# Patient Record
Sex: Female | Born: 1995 | Race: Black or African American | Hispanic: No | Marital: Single | Smoking: Former smoker
Health system: Southern US, Community
[De-identification: ages and names within clinical notes are randomized; demographics above are authoritative.]

## PROBLEM LIST (undated history)

## (undated) ENCOUNTER — Inpatient Hospital Stay: Payer: Self-pay

## (undated) DIAGNOSIS — A599 Trichomoniasis, unspecified: Secondary | ICD-10-CM

## (undated) HISTORY — DX: Trichomoniasis, unspecified: A59.9

---
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
## 2015-01-06 ENCOUNTER — Emergency Department: Payer: Self-pay | Admitting: Emergency Medicine

## 2015-02-18 NOTE — H&P (Signed)
L&D Evaluation:  History:  HPI 19 yo G1 at 5624w2d  gestational age by LMP consistent with 19 weeks ultrasound.  Her pregnany complicated by the fact that she is a teenager, smoking and marijuana use (though, she has supposedly quit).  She presents with regular uterine contractions.  Denies leakage of fluid and vaginal bleeding.  She notes positive fetal movement. O+ / RPR NR/ RI / HBsAg neg / VZNI / GBS NEG (4/28)   Patient's Medical History No Chronic Illness   Patient's Surgical History none   Medications Pre Natal Vitamins   Allergies NKDA   Social History tobacco  drugs  Marijuana   Family History Non-Contributory   ROS:  ROS All systems were reviewed.  HEENT, CNS, GI, GU, Respiratory, CV, Renal and Musculoskeletal systems were found to be normal., x 10 unless noted in HPI   Exam:  Vital Signs stable   General no apparent distress   Mental Status clear   Chest clear   Heart normal sinus rhythm   Abdomen gravid, non-tender   Estimated Fetal Weight Average for gestational age   Back no CVAT   Edema no edema   Pelvic no external lesions, 4-5 cm per RN   Mebranes Intact   FHT normal rate with no decels   FHT Description 135/mod var/+accels/ no decels   Ucx regular   Skin 2-3 q 10 min   Impression:  Impression active labor, reactive NST   Plan:  Plan EFM/NST, monitor contractions and for cervical change, fluids   Comments - admit for labor - IVF/clear liquid diet - CBC/T&S - CEFM/TOCO -GBS neg - urine drug screen for history of use   Electronic Signatures: Conard NovakJackson, Stephen D (MD)  (Signed 18-May-14 15:48)  Authored: L&D Evaluation   Last Updated: 18-May-14 15:48 by Conard NovakJackson, Stephen D (MD)

## 2016-06-17 ENCOUNTER — Emergency Department
Admission: EM | Admit: 2016-06-17 | Discharge: 2016-06-17 | Disposition: A | Discharge status: Home / Self Care | Payer: Medicaid Other | Attending: Emergency Medicine | Admitting: Emergency Medicine

## 2016-06-17 DIAGNOSIS — O210 Mild hyperemesis gravidarum: Secondary | ICD-10-CM | POA: Insufficient documentation

## 2016-06-17 DIAGNOSIS — O219 Vomiting of pregnancy, unspecified: Secondary | ICD-10-CM | POA: Diagnosis present

## 2016-06-17 DIAGNOSIS — Z3A01 Less than 8 weeks gestation of pregnancy: Secondary | ICD-10-CM | POA: Insufficient documentation

## 2016-06-17 DIAGNOSIS — F17200 Nicotine dependence, unspecified, uncomplicated: Secondary | ICD-10-CM | POA: Diagnosis not present

## 2016-06-17 DIAGNOSIS — O99331 Smoking (tobacco) complicating pregnancy, first trimester: Secondary | ICD-10-CM | POA: Diagnosis not present

## 2016-06-17 DIAGNOSIS — R1110 Vomiting, unspecified: Secondary | ICD-10-CM

## 2016-06-17 DIAGNOSIS — Z3491 Encounter for supervision of normal pregnancy, unspecified, first trimester: Secondary | ICD-10-CM

## 2016-06-17 LAB — CBC
HCT: 36.2 % (ref 35.0–47.0)
HEMOGLOBIN: 12.7 g/dL (ref 12.0–16.0)
MCH: 30.6 pg (ref 26.0–34.0)
MCHC: 34.9 g/dL (ref 32.0–36.0)
MCV: 87.5 fL (ref 80.0–100.0)
PLATELETS: 204 10*3/uL (ref 150–440)
RBC: 4.14 MIL/uL (ref 3.80–5.20)
RDW: 12.3 % (ref 11.5–14.5)
WBC: 7.1 10*3/uL (ref 3.6–11.0)

## 2016-06-17 LAB — BASIC METABOLIC PANEL
ANION GAP: 6 (ref 5–15)
BUN: 9 mg/dL (ref 6–20)
CALCIUM: 9.5 mg/dL (ref 8.9–10.3)
CO2: 25 mmol/L (ref 22–32)
CREATININE: 0.68 mg/dL (ref 0.44–1.00)
Chloride: 104 mmol/L (ref 101–111)
GFR calc non Af Amer: >60 mL/min (ref >60)
Glucose, Bld: 100 mg/dL — ABNORMAL HIGH (ref 65–99)
Potassium: 3.6 mmol/L (ref 3.5–5.1)
SODIUM: 135 mmol/L (ref 135–145)

## 2016-06-17 LAB — URINALYSIS COMPLETE WITH MICROSCOPIC (ARMC ONLY)
BILIRUBIN URINE: NEGATIVE
Bacteria, UA: NONE SEEN
Glucose, UA: NEGATIVE mg/dL
HGB URINE DIPSTICK: NEGATIVE
LEUKOCYTES UA: NEGATIVE
NITRITE: NEGATIVE
PH: 6 (ref 5.0–8.0)
PROTEIN: 30 mg/dL — AB
RBC / HPF: NONE SEEN RBC/hpf (ref 0–5)
SPECIFIC GRAVITY, URINE: 1.033 — AB (ref 1.005–1.030)

## 2016-06-17 LAB — HCG, QUANTITATIVE, PREGNANCY: hCG, Beta Chain, Quant, S: 112766 m[IU]/mL — ABNORMAL HIGH (ref <5)

## 2016-06-17 MED ORDER — SODIUM CHLORIDE 0.9 % IV SOLN
Freq: Once | INTRAVENOUS | Status: AC
  Administered 2016-06-17: 20:00:00 via INTRAVENOUS

## 2016-06-17 MED ORDER — ONDANSETRON HCL 4 MG PO TABS: 4.0000 mg | ORAL_TABLET | Freq: Every day | ORAL | 1 refills | Status: DC | PRN

## 2016-06-17 MED ORDER — ONDANSETRON HCL 4 MG/2ML IJ SOLN
4.0000 mg | Freq: Once | INTRAMUSCULAR | Status: DC
  Filled 2016-06-17: qty 2

## 2016-06-17 NOTE — ED Triage Notes (Signed)
Pt arrives with reports of excessive vomiting over the last several days  Pt reports last menstrual cycle sometime around 04/19/2016 and estimates that she is [redacted] weeks pregnant   Pt reports that yesterday her emesis was green and had large green clots

## 2016-06-17 NOTE — ED Provider Notes (Signed)
[facility] Emergency Department Provider Note        Time seen: ----------------------------------------- 5:14 PM on 06/17/2016 -----------------------------------------    I have reviewed the triage vital signs and the nursing notes.   HISTORY  Chief Complaint Morning Sickness; Headache; and Dizziness    HPI Jamie Gentry is a 20 y.o. female who presents to the ER for excessive vomiting over the last several days. Patient thinks she is around [redacted] weeks pregnant, last menstrual cycle was around July 10. She is G2 P1 AB 0, reports that yesterday here her emesis was green and previously he had have been yellow. She did eat breakfast normally this morning. She states she did not have this problem with the first pregnancy.   No past medical history on file.  There are no active problems to display for this patient.   No past surgical history on file.  Allergies Review of patient's allergies indicates no known allergies.  Social History Social History  Substance Use Topics  . Smoking status: Current Every Day Smoker  . Smokeless tobacco: Not on file  . Alcohol use No    Review of Systems Constitutional: Negative for fever. Cardiovascular: Negative for chest pain. Respiratory: Negative for shortness of breath. Gastrointestinal: Negative for abdominal pain,Positive for vomiting Genitourinary: Negative for dysuria. Musculoskeletal: Negative for back pain. Skin: Negative for rash. Neurological: Negative for headaches, positive for weakness  10-point ROS otherwise negative.  ____________________________________________   PHYSICAL EXAM:  VITAL SIGNS: ED Triage Vitals  Enc Vitals Group     BP 06/17/16 1448 101/71     Pulse Rate 06/17/16 1448 98     Resp 06/17/16 1448 20     Temp 06/17/16 1448 98.5 F (36.9 C)     Temp Source 06/17/16 1448 Oral     SpO2 06/17/16 1448 100 %     Weight 06/17/16 1450 162 lb (73.5 kg)     Height 06/17/16  1450 5\' 3"  (1.6 m)     Head Circumference --      Peak Flow --      Pain Score 06/17/16 1454 8     Pain Loc --      Pain Edu? --      Excl. in GC? --     Constitutional: Alert and oriented. Well appearing and in no distress. Eyes: Conjunctivae are normal. PERRL. Normal extraocular movements. ENT   Head: Normocephalic and atraumatic.   Nose: No congestion/rhinnorhea.   Mouth/Throat: Mucous membranes are moist.   Neck: No stridor. Cardiovascular: Normal rate, regular rhythm. No murmurs, rubs, or gallops. Respiratory: Normal respiratory effort without tachypnea nor retractions. Breath sounds are clear and equal bilaterally. No wheezes/rales/rhonchi. Gastrointestinal: Soft and nontender. Normal bowel sounds Musculoskeletal: Nontender with normal range of motion in all extremities. No lower extremity tenderness nor edema. Neurologic:  Normal speech and language. No gross focal neurologic deficits are appreciated.  Skin:  Skin is warm, dry and intact. No rash noted. Psychiatric: Mood and affect are normal. Speech and behavior are normal.  ____________________________________________  ED COURSE:  Pertinent labs & imaging results that were available during my care of the patient were reviewed by me and considered in my medical decision making (see chart for details). Clinical Course  Patient is in no distress, we will assess with basic labs, give IV fluids and antiemetics.  Procedures ____________________________________________   LABS (pertinent positives/negatives)  Labs Reviewed  BASIC METABOLIC PANEL - Abnormal; Notable for the following:  Result Value   Glucose, Bld 100 (*)    All other components within normal limits  URINALYSIS COMPLETEWITH MICROSCOPIC ([facility] ONLY) - Abnormal; Notable for the following:    Color, Urine YELLOW (*)    APPearance HAZY (*)    Ketones, ur TRACE (*)    Specific Gravity, Urine 1.033 (*)    Protein, ur 30 (*)    Squamous  Epithelial / LPF 0-5 (*)    All other components within normal limits  HCG, QUANTITATIVE, PREGNANCY - Abnormal; Notable for the following:    hCG, Beta Chain, Quant, S 112,766 (*)    All other components within normal limits  CBC  ____________________________________________  FINAL ASSESSMENT AND PLAN  Hyperemesis  Plan: Patient with labs as dictated above. Patient is likely [redacted] weeks pregnant, she has no abdominal pain and a normal exam. Labs are reassuring other than perhaps mild dehydration. She'll be discharged with antiemetics and referred to GYN for follow-up.   Emily FilbertWilliams, Jamie Pennel E, MD   Note: This dictation was prepared with Dragon dictation. Any transcriptional errors that result from this process are unintentional    Emily FilbertJonathan Gentry Sandra Tellefsen, MD 06/17/16 [phone]

## 2016-06-18 LAB — POCT PREGNANCY, URINE: PREG TEST UR: POSITIVE — AB

## 2016-06-22 ENCOUNTER — Emergency Department
Admission: EM | Admit: 2016-06-22 | Discharge: 2016-06-22 | Disposition: A | Discharge status: Home / Self Care | Payer: Medicaid Other | Attending: Emergency Medicine | Admitting: Emergency Medicine

## 2016-06-22 ENCOUNTER — Emergency Department: Payer: Medicaid Other

## 2016-06-22 DIAGNOSIS — O99331 Smoking (tobacco) complicating pregnancy, first trimester: Secondary | ICD-10-CM | POA: Insufficient documentation

## 2016-06-22 DIAGNOSIS — R1030 Lower abdominal pain, unspecified: Secondary | ICD-10-CM | POA: Insufficient documentation

## 2016-06-22 DIAGNOSIS — O210 Mild hyperemesis gravidarum: Secondary | ICD-10-CM | POA: Insufficient documentation

## 2016-06-22 DIAGNOSIS — Z3A08 8 weeks gestation of pregnancy: Secondary | ICD-10-CM | POA: Diagnosis not present

## 2016-06-22 DIAGNOSIS — O26891 Other specified pregnancy related conditions, first trimester: Secondary | ICD-10-CM | POA: Diagnosis present

## 2016-06-22 DIAGNOSIS — F17200 Nicotine dependence, unspecified, uncomplicated: Secondary | ICD-10-CM | POA: Insufficient documentation

## 2016-06-22 LAB — COMPREHENSIVE METABOLIC PANEL
ALBUMIN: 4.2 g/dL (ref 3.5–5.0)
ALK PHOS: 49 U/L (ref 38–126)
ALT: 21 U/L (ref 14–54)
AST: 14 U/L — AB (ref 15–41)
Anion gap: 6 (ref 5–15)
BILIRUBIN TOTAL: 0.6 mg/dL (ref 0.3–1.2)
BUN: 8 mg/dL (ref 6–20)
CO2: 24 mmol/L (ref 22–32)
CREATININE: 0.62 mg/dL (ref 0.44–1.00)
Calcium: 9.7 mg/dL (ref 8.9–10.3)
Chloride: 103 mmol/L (ref 101–111)
GFR calc Af Amer: >60 mL/min (ref >60)
GFR calc non Af Amer: >60 mL/min (ref >60)
GLUCOSE: 72 mg/dL (ref 65–99)
POTASSIUM: 3.7 mmol/L (ref 3.5–5.1)
Sodium: 133 mmol/L — ABNORMAL LOW (ref 135–145)
TOTAL PROTEIN: 7.5 g/dL (ref 6.5–8.1)

## 2016-06-22 LAB — CBC WITH DIFFERENTIAL/PLATELET
BASOS ABS: 0 10*3/uL (ref 0–0.1)
BASOS PCT: 0 %
Eosinophils Absolute: 0 10*3/uL (ref 0–0.7)
Eosinophils Relative: 1 %
HEMATOCRIT: 36.5 % (ref 35.0–47.0)
HEMOGLOBIN: 13 g/dL (ref 12.0–16.0)
LYMPHS PCT: 25 %
Lymphs Abs: 1.8 10*3/uL (ref 1.0–3.6)
MCH: 31.1 pg (ref 26.0–34.0)
MCHC: 35.5 g/dL (ref 32.0–36.0)
MCV: 87.6 fL (ref 80.0–100.0)
Monocytes Absolute: 0.6 10*3/uL (ref 0.2–0.9)
Monocytes Relative: 8 %
NEUTROS ABS: 4.7 10*3/uL (ref 1.4–6.5)
NEUTROS PCT: 66 %
Platelets: 185 10*3/uL (ref 150–440)
RBC: 4.17 MIL/uL (ref 3.80–5.20)
RDW: 12.3 % (ref 11.5–14.5)
WBC: 7.1 10*3/uL (ref 3.6–11.0)

## 2016-06-22 LAB — URINALYSIS COMPLETE WITH MICROSCOPIC (ARMC ONLY)
BILIRUBIN URINE: NEGATIVE
Bacteria, UA: NONE SEEN
Glucose, UA: NEGATIVE mg/dL
Hgb urine dipstick: NEGATIVE
Leukocytes, UA: NEGATIVE
Nitrite: NEGATIVE
PH: 7 (ref 5.0–8.0)
PROTEIN: NEGATIVE mg/dL
Specific Gravity, Urine: 1.012 (ref 1.005–1.030)

## 2016-06-22 LAB — HCG, QUANTITATIVE, PREGNANCY: hCG, Beta Chain, Quant, S: 150488 m[IU]/mL — ABNORMAL HIGH (ref <5)

## 2016-06-22 MED ORDER — SODIUM CHLORIDE 0.9 % IV BOLUS (SEPSIS)
1000.0000 mL | Freq: Once | INTRAVENOUS | Status: AC
  Administered 2016-06-22: 1000 mL via INTRAVENOUS

## 2016-06-22 MED ORDER — ONDANSETRON HCL 4 MG/2ML IJ SOLN
4.0000 mg | Freq: Once | INTRAMUSCULAR | Status: AC
  Administered 2016-06-22: 4 mg via INTRAVENOUS
  Filled 2016-06-22 (×2): qty 2

## 2016-06-22 NOTE — ED Triage Notes (Signed)
Pt c/o BL rib pain with abd pain and nausea since seen here on 9/7.Marland Kitchen. Denies vomiting or diarrhea..states she is just feeling weak..Marland Kitchen

## 2016-06-22 NOTE — ED Provider Notes (Signed)
Time Seen: Approximately 1607  I have reviewed the triage notes  Chief Complaint: Abdominal Pain   History of Present Illness: Jamie Gentry is a 20 y.o. female who was seen recently here on 9/7. Patient was diagnosed with hyperemesis gravidarum and had a negative evaluation at that time. She was prescribed Zofran. She is by dates [redacted] weeks pregnant. Patient states she didn't have any lower abdominal pain and describes today some occasional left lower quadrant abdominal pain and also bilateral upper abdominal pain associated with dry heaves. She denies any fever or productive cough or shortness of breath. She states she feels lightheaded without any syncopal episode. She denies any persistent headaches or focal weakness in either upper or lower extremities. She denies any vaginal discharge or bleeding. She denies any dysuria, hematuria or urinary frequency.   History reviewed. No pertinent past medical history.  There are no active problems to display for this patient.   History reviewed. No pertinent surgical history.  History reviewed. No pertinent surgical history.  Current Outpatient Rx  . Order #: [id number] Class: Print    Allergies:  Review of patient's allergies indicates no known allergies.  Family History: No family history on file.  Social History: Social History  Substance Use Topics  . Smoking status: Current Every Day Smoker  . Smokeless tobacco: Never Used  . Alcohol use No     Review of Systems:   10 point review of systems was performed and was otherwise negative:  Constitutional: No fever Eyes: No visual disturbances ENT: No sore throat, ear pain Cardiac: No chest pain Respiratory: No shortness of breath, wheezing, or stridor Abdomen:She describes bilateral upper abdominal discomfort crampy in nature. She also describes some mild left lower quadrant abdominal pain. She denies much in way of back or flank discomfort. She still feels nauseated without  persistent vomiting since this morning. He denies any hematemesis or biliary emesis. Endocrine: No weight loss, No night sweats Extremities: No peripheral edema, cyanosis Skin: No rashes, easy bruising Neurologic: No focal weakness, trouble with speech or swollowing Urologic: No dysuria, Hematuria, or urinary frequency   Physical Exam:  ED Triage Vitals  Enc Vitals Group     BP 06/22/16 1559 (!) 114/58     Pulse Rate 06/22/16 1559 100     Resp 06/22/16 1559 18     Temp 06/22/16 1559 98 F (36.7 C)     Temp Source 06/22/16 1559 Oral     SpO2 06/22/16 1559 100 %     Weight 06/22/16 1554 162 lb (73.5 kg)     Height 06/22/16 1554 5\' 3"  (1.6 m)     Head Circumference --      Peak Flow --      Pain Score 06/22/16 1554 8     Pain Loc --      Pain Edu? --      Excl. in GC? --     General: Awake , Alert , and Oriented times 3; GCS 15 Head: Normal cephalic , atraumatic Eyes: Pupils equal , round, reactive to light Nose/Throat: No nasal drainage, patent upper airway without erythema or exudate.  Neck: Supple, Full range of motion, No anterior adenopathy or palpable thyroid masses Lungs: Clear to ascultation without wheezes , rhonchi, or rales Heart: Regular rate, regular rhythm without murmurs , gallops , or rubs Abdomen: Soft, non tender without rebound, guarding , or rigidity; bowel sounds positive and symmetric in all 4 quadrants. No organomegaly .  No focal tenderness over McBurney's point, negative Murphy's sign Extremities: 2 plus symmetric pulses. No edema, clubbing or cyanosis Neurologic: normal ambulation, Motor symmetric without deficits, sensory intact Skin: warm, dry, no rashes   Labs:   All laboratory work was reviewed including any pertinent negatives or positives listed below:  Labs Reviewed  CBC WITH DIFFERENTIAL/PLATELET  COMPREHENSIVE METABOLIC PANEL  URINALYSIS COMPLETEWITH MICROSCOPIC ([facility] ONLY)  HCG, QUANTITATIVE, PREGNANCY  Laboratory work was  reviewed and showed no clinically significant abnormalities.    Radiology:  "US Ob Comp Less 14 Wks  Result Date: 06/22/2016 CLINICAL DATA:  20 y/o  F; left lower abdominal pain for 2 days. EXAM: OBSTETRIC <14 WK [place] AND TRANSVAGINAL OB US TECHNIQUE: Both transabdominal and transvaginal ultrasound examinations were performed for complete evaluation of the gestation as well as the maternal uterus, adnexal regions, and pelvic cul-de-sac. Transvaginal technique was performed to assess early pregnancy. COMPARISON:  None. FINDINGS: Intrauterine gestational sac: Single Yolk sac:  Present. Embryo:  Present. Cardiac Activity: Present. Heart Rate: 171  bpm CRL:  19  mm   8 w   3 d                  [place] EDC: 01/29/2017 Subchorionic hemorrhage:  None visualized. Maternal uterus/adnexae: Normal uterus. Tiny nabothian cyst. Left ovary measures 3.9 x 3.8 x 3.0 cm. Anechoic well-circumscribed lobulated structure with thin septation within the left ovary measuring 3.3 x 3.5 x 3.3 cm, likely benign, no follow-up needed. Normal right ovary measuring 3.0 x 1.8 by 2.7 cm. IMPRESSION: 1. Single live intrauterine pregnancy.  No acute process identified. 2. Left ovarian cyst with thin septation measuring up to 3.5 cm, likely benign, no follow-up needed. Electronically Signed   By: Mitzi Hansen M.D.   On: 06/22/2016 17:48   US Ob Transvaginal  Result Date: 06/22/2016 CLINICAL DATA:  20 y/o  F; left lower abdominal pain for 2 days. EXAM: OBSTETRIC <14 WK [place] AND TRANSVAGINAL OB US TECHNIQUE: Both transabdominal and transvaginal ultrasound examinations were performed for complete evaluation of the gestation as well as the maternal uterus, adnexal regions, and pelvic cul-de-sac. Transvaginal technique was performed to assess early pregnancy. COMPARISON:  None. FINDINGS: Intrauterine gestational sac: Single Yolk sac:  Present. Embryo:  Present. Cardiac Activity: Present. Heart Rate: 171  bpm CRL:  19  mm   8 w   3 d                   [place] EDC: 01/29/2017 Subchorionic hemorrhage:  None visualized. Maternal uterus/adnexae: Normal uterus. Tiny nabothian cyst. Left ovary measures 3.9 x 3.8 x 3.0 cm. Anechoic well-circumscribed lobulated structure with thin septation within the left ovary measuring 3.3 x 3.5 x 3.3 cm, likely benign, no follow-up needed. Normal right ovary measuring 3.0 x 1.8 by 2.7 cm. IMPRESSION: 1. Single live intrauterine pregnancy.  No acute process identified. 2. Left ovarian cyst with thin septation measuring up to 3.5 cm, likely benign, no follow-up needed. Electronically Signed   By: Mitzi Hansen M.D.   On: 06/22/2016 17:48  "   I personally reviewed the radiologic studies    ED Course: * Patient's stay was uneventful and she felt symptomatically improved after a liter of fluid, IV Zofran,. Patient appears to have some residual abdominal discomfort from hyperemesis gravidarum. Her ultrasound shows a normally located, normally developing intrauterine pregnancy. I felt this was unlikely to be a surgical issue such as acute appendicitis or acute cholecystitis. Patient does have  [facility]     Assessment:  Hyperemesis gravidarum      Plan:  Outpatient Patient was advised to return immediately if condition worsens. Patient was advised to follow up with their primary care physician or other specialized physicians involved in their outpatient care. The patient and/or family member/power of attorney had laboratory results reviewed at the bedside. All questions and concerns were addressed and appropriate discharge instructions were distributed by the nursing staff.              Jennye MoccasinBrian S Quigley, MD 06/22/16 [phone]

## 2016-06-22 NOTE — Discharge Instructions (Signed)
Please take over-the-counter Tylenol. You can take 2 Tylenol extra strength tablets every 6 hours. Over-the-counter Maalox or Mylanta. Return to the emergency Department for persistent vaginal bleeding, vomit blood, fever, or any other new concerns.  Please return immediately if condition worsens. Please contact her primary physician or the physician you were given for referral. If you have any specialist physicians involved in her treatment and plan please also contact them. Thank you for using [place] [facility] emergency Department.

## 2016-07-15 DIAGNOSIS — A599 Trichomoniasis, unspecified: Secondary | ICD-10-CM

## 2016-07-15 HISTORY — DX: Trichomoniasis, unspecified: A59.9

## 2016-07-15 LAB — OB RESULTS CONSOLE HIV ANTIBODY (ROUTINE TESTING)
HIV: NONREACTIVE
HIV: NONREACTIVE

## 2016-07-15 LAB — OB RESULTS CONSOLE ABO/RH: RH TYPE: POSITIVE

## 2016-07-15 LAB — OB RESULTS CONSOLE GC/CHLAMYDIA
Chlamydia: NEGATIVE
GC PROBE AMP, GENITAL: NEGATIVE

## 2016-07-15 LAB — OB RESULTS CONSOLE HEPATITIS B SURFACE ANTIGEN: Hepatitis B Surface Ag: NEGATIVE

## 2016-07-15 LAB — OB RESULTS CONSOLE RPR
RPR: NONREACTIVE
RPR: NONREACTIVE
RPR: NONREACTIVE

## 2016-07-15 LAB — OB RESULTS CONSOLE VARICELLA ZOSTER ANTIBODY, IGG: Varicella: NON-IMMUNE/NOT IMMUNE

## 2016-07-15 LAB — SICKLE CELL SCREEN: Sickle Cell Screen: NORMAL

## 2016-07-15 LAB — OB RESULTS CONSOLE ANTIBODY SCREEN: ANTIBODY SCREEN: NEGATIVE

## 2016-07-15 LAB — OB RESULTS CONSOLE RUBELLA ANTIBODY, IGM: RUBELLA: IMMUNE

## 2016-07-25 ENCOUNTER — Emergency Department
Admission: EM | Admit: 2016-07-25 | Discharge: 2016-07-25 | Disposition: A | Discharge status: Home / Self Care | Payer: Medicaid Other | Attending: Emergency Medicine | Admitting: Emergency Medicine

## 2016-07-25 ENCOUNTER — Emergency Department: Payer: Medicaid Other

## 2016-07-25 DIAGNOSIS — R51 Headache: Secondary | ICD-10-CM | POA: Diagnosis not present

## 2016-07-25 DIAGNOSIS — Z3A14 14 weeks gestation of pregnancy: Secondary | ICD-10-CM | POA: Diagnosis not present

## 2016-07-25 DIAGNOSIS — O219 Vomiting of pregnancy, unspecified: Secondary | ICD-10-CM | POA: Insufficient documentation

## 2016-07-25 DIAGNOSIS — R0602 Shortness of breath: Secondary | ICD-10-CM | POA: Diagnosis not present

## 2016-07-25 DIAGNOSIS — O99331 Smoking (tobacco) complicating pregnancy, first trimester: Secondary | ICD-10-CM | POA: Insufficient documentation

## 2016-07-25 DIAGNOSIS — F17200 Nicotine dependence, unspecified, uncomplicated: Secondary | ICD-10-CM | POA: Insufficient documentation

## 2016-07-25 DIAGNOSIS — O26891 Other specified pregnancy related conditions, first trimester: Secondary | ICD-10-CM | POA: Insufficient documentation

## 2016-07-25 DIAGNOSIS — R519 Headache, unspecified: Secondary | ICD-10-CM

## 2016-07-25 DIAGNOSIS — H538 Other visual disturbances: Secondary | ICD-10-CM | POA: Insufficient documentation

## 2016-07-25 LAB — URINALYSIS COMPLETE WITH MICROSCOPIC (ARMC ONLY)
BACTERIA UA: NONE SEEN
Bilirubin Urine: NEGATIVE
GLUCOSE, UA: NEGATIVE mg/dL
Hgb urine dipstick: NEGATIVE
Ketones, ur: NEGATIVE mg/dL
Leukocytes, UA: NEGATIVE
Nitrite: NEGATIVE
PROTEIN: NEGATIVE mg/dL
Specific Gravity, Urine: 1.006 (ref 1.005–1.030)
pH: 6 (ref 5.0–8.0)

## 2016-07-25 LAB — COMPREHENSIVE METABOLIC PANEL
ALBUMIN: 3.7 g/dL (ref 3.5–5.0)
ALT: 16 U/L (ref 14–54)
AST: 16 U/L (ref 15–41)
Alkaline Phosphatase: 39 U/L (ref 38–126)
Anion gap: 4 — ABNORMAL LOW (ref 5–15)
BUN: 5 mg/dL — AB (ref 6–20)
CHLORIDE: 106 mmol/L (ref 101–111)
CO2: 24 mmol/L (ref 22–32)
CREATININE: 0.54 mg/dL (ref 0.44–1.00)
Calcium: 8.9 mg/dL (ref 8.9–10.3)
GFR calc Af Amer: >60 mL/min (ref >60)
GLUCOSE: 89 mg/dL (ref 65–99)
POTASSIUM: 4.2 mmol/L (ref 3.5–5.1)
Sodium: 134 mmol/L — ABNORMAL LOW (ref 135–145)
Total Bilirubin: 0.8 mg/dL (ref 0.3–1.2)
Total Protein: 6.5 g/dL (ref 6.5–8.1)

## 2016-07-25 LAB — CBC WITH DIFFERENTIAL/PLATELET
BASOS ABS: 0 10*3/uL (ref 0–0.1)
BASOS PCT: 0 %
Eosinophils Absolute: 0 10*3/uL (ref 0–0.7)
Eosinophils Relative: 1 %
HEMATOCRIT: 31.1 % — AB (ref 35.0–47.0)
Hemoglobin: 11.2 g/dL — ABNORMAL LOW (ref 12.0–16.0)
LYMPHS PCT: 22 %
Lymphs Abs: 1.5 10*3/uL (ref 1.0–3.6)
MCH: 31.4 pg (ref 26.0–34.0)
MCHC: 36.1 g/dL — ABNORMAL HIGH (ref 32.0–36.0)
MCV: 87.1 fL (ref 80.0–100.0)
Monocytes Absolute: 0.5 10*3/uL (ref 0.2–0.9)
Monocytes Relative: 8 %
NEUTROS ABS: 4.7 10*3/uL (ref 1.4–6.5)
Neutrophils Relative %: 69 %
PLATELETS: 178 10*3/uL (ref 150–440)
RBC: 3.57 MIL/uL — AB (ref 3.80–5.20)
RDW: 12.5 % (ref 11.5–14.5)
WBC: 6.8 10*3/uL (ref 3.6–11.0)

## 2016-07-25 LAB — FIBRIN DERIVATIVES D-DIMER (ARMC ONLY): FIBRIN DERIVATIVES D-DIMER (ARMC): 308 (ref 0–499)

## 2016-07-25 LAB — PROTEIN / CREATININE RATIO, URINE: CREATININE, URINE: 65 mg/dL

## 2016-07-25 MED ORDER — METOCLOPRAMIDE HCL 5 MG/ML IJ SOLN
10.0000 mg | Freq: Once | INTRAMUSCULAR | Status: AC
  Administered 2016-07-25: 10 mg via INTRAVENOUS

## 2016-07-25 MED ORDER — SODIUM CHLORIDE 0.9 % IV BOLUS (SEPSIS)
1000.0000 mL | Freq: Once | INTRAVENOUS | Status: AC
  Administered 2016-07-25: 1000 mL via INTRAVENOUS

## 2016-07-25 MED ORDER — ACETAMINOPHEN 500 MG PO TABS
1000.0000 mg | ORAL_TABLET | Freq: Once | ORAL | Status: AC
  Administered 2016-07-25: 1000 mg via ORAL

## 2016-07-25 NOTE — ED Provider Notes (Signed)
[facility] Emergency Department Provider Note  ____________________________________________  Time seen: Approximately 3:28 PM  I have reviewed the triage vital signs and the nursing notes.   HISTORY  Chief Complaint Shortness of Breath   HPI Jamie Gentry is a 20 y.o. female G2P1 at [redacted] weeks GA resents for evaluation of the shortness of breath and headache. Patient reports for the last 3 days she's been having intermittent episodes of shortness of breath lasting a few minutes at a time. Today she was at her brother's house and had one of these episodes which prompted her brother to call EMS. She reports that the sling, rest and exertion. She feels difficulty breathing and a sensation that the shortness of breath climbs up to her throat. These episodes resolve within a few minutes with no intervention. She denies any chest pain associated with the episodes. No cough, no fever, no personal or family history blood clots, no recent travel or immobilization, no leg pain or swelling, no hemoptysis. She is a former smoker but stopped when she found out she was pregnant. She denies any drug use. She denies any history of asthma or COPD. Patient is also complaining of headache for the last 4 days. She reports the headaches a 10 out of 10, located in her left temple area, associated with blurry vision. She has been trying Tylenol 325 mg at home with no improvement of her pain. She also reports couple episodes of nonbloody nonbilious emesis associated with the headache. She does endorse a history of migraine headaches but reports that these are worse. No neck stiffness, rash, or fever. She has already established prenatal care for this pregnancy and has had a [place] confirming IUP. No abdominal pain, contractions, vaginal bleeding.  No past medical history on file.  There are no active problems to display for this patient.   No past surgical history on file.  Prior to  Admission medications   Not on File    Allergies Review of patient's allergies indicates no known allergies.  No family history on file.  Social History Social History  Substance Use Topics  . Smoking status: Current Every Day Smoker  . Smokeless tobacco: Never Used  . Alcohol use No    Review of Systems  Constitutional: Negative for fever. Eyes: + blurry vision. ENT: Negative for sore throat. Cardiovascular: Negative for chest pain. Respiratory: + shortness of breath. Gastrointestinal: Negative for abdominal pain, diarrhea. + N/V Genitourinary: Negative for dysuria. Musculoskeletal: Negative for back pain. Skin: Negative for rash. Neurological: Negative for weakness or numbness. + HA  ____________________________________________   PHYSICAL EXAM:  VITAL SIGNS: ED Triage Vitals  Enc Vitals Group     BP 07/25/16 1521 121/75     Pulse Rate 07/25/16 1521 94     Resp 07/25/16 1521 18     Temp --      Temp src --      SpO2 07/25/16 1521 100 %     Weight 07/25/16 1522 169 lb (76.7 kg)     Height 07/25/16 1522 5\' 3"  (1.6 m)     Head Circumference --      Peak Flow --      Pain Score --      Pain Loc --      Pain Edu? --      Excl. in GC? --     Constitutional: Alert and oriented. Well appearing and in no apparent distress. HEENT:      Head:  Normocephalic and atraumatic.         Eyes: Conjunctivae are normal. Sclera is non-icteric. EOMI. PERRL      Mouth/Throat: Mucous membranes are moist.       Neck: Supple with no signs of meningismus. Cardiovascular: Regular rate and rhythm. No murmurs, gallops, or rubs. 2+ symmetrical distal pulses are present in all extremities. No JVD. Respiratory: Normal respiratory effort. Lungs are clear to auscultation bilaterally. No wheezes, crackles, or rhonchi.  Gastrointestinal: Soft, non tender, and non distended with positive bowel sounds. No rebound or guarding. Musculoskeletal: Nontender with normal range of motion in all  extremities. No edema, cyanosis, or erythema of extremities. Neurologic: Normal speech and language. A & O x3, PERRL, no nystagmus, CN II-XII intact, motor testing reveals good tone and bulk throughout. There is no evidence of pronator drift or dysmetria. Muscle strength is 5/5 throughout. Deep tendon reflexes are 2+ throughout with downgoing toes. Sensory examination is intact. Gait is normal. Skin: Skin is warm, dry and intact. No rash noted. Psychiatric: Mood and affect are normal. Speech and behavior are normal.  ____________________________________________   LABS (all labs ordered are listed, but only abnormal results are displayed)  Labs Reviewed  CBC WITH DIFFERENTIAL/PLATELET - Abnormal; Notable for the following:       Result Value   RBC 3.57 (*)    Hemoglobin 11.2 (*)    HCT 31.1 (*)    MCHC 36.1 (*)    All other components within normal limits  COMPREHENSIVE METABOLIC PANEL - Abnormal; Notable for the following:    Sodium 134 (*)    BUN 5 (*)    Anion gap 4 (*)    All other components within normal limits  URINALYSIS COMPLETEWITH MICROSCOPIC ([facility] ONLY) - Abnormal; Notable for the following:    Color, Urine STRAW (*)    APPearance CLEAR (*)    Squamous Epithelial / LPF 0-5 (*)    All other components within normal limits  FIBRIN DERIVATIVES D-DIMER ([facility] ONLY)  PROTEIN / CREATININE RATIO, URINE   ____________________________________________  EKG  ED ECG REPORT I, Nita Sickle, the attending physician, personally viewed and interpreted this ECG.  Normal sinus rhythm, rate of 94, normal intervals, normal axis, no ST elevations or depressions. No prior for comparison ____________________________________________  RADIOLOGY  CXR:  No active cardiopulmonary disease ____________________________________________   PROCEDURES  Procedure(s) performed: None Procedures Critical Care performed:  None ____________________________________________   INITIAL  IMPRESSION / ASSESSMENT AND PLAN / ED COURSE   20 y.o. female G2P1 at [redacted] weeks GA resents for evaluation of the shortness of breath and headache. Patient with a short lived recurring episodes of difficulty breathing over the course of the last 3 days with no other associated symptoms. The fact the patient is pregnant does increase her chance of having a blood clot. On exam she is breathing comfortably, satting well on room air, lungs sounds are clear to auscultation. We'll check a chest x-ray and a d-dimer. We'll also get an EKG and a troponin although the description sounds very atypical for ACS especially with no chest pain. We'll also check CBC, CMP, UPC to rule out for evidence of HELLP syndrome or pre-eclampsia. Will treat headache with by mouth Tylenol, IV fluids and IV Reglan.  Clinical Course  Comment By Time  Bedside ultrasound showing IUP with good fetal movement and fetal heart rate of 154 Nita Sickle, MD 10/15 1806  Patient feels markedly improved with no further episodes of shortness of breath. D-dimer  is negative. Headache is resolved. No signs of hellp syndrome or preeclampsia with normal BP, normal platelets, normal LFTs, normal UPC. Plan to dc home with close f/u with OB. Nita Sicklearolina Louella Medaglia, MD 10/15 1909    Pertinent labs & imaging results that were available during my care of the patient were reviewed by me and considered in my medical decision making (see chart for details).    ____________________________________________   FINAL CLINICAL IMPRESSION(S) / ED DIAGNOSES  Final diagnoses:  Acute nonintractable headache, unspecified headache type  SOB (shortness of breath)      NEW MEDICATIONS STARTED DURING THIS VISIT:  New Prescriptions   No medications on file     Note:  This document was prepared using Dragon voice recognition software and may include unintentional dictation errors.    Nita Sicklearolina Kandon Hosking, MD 07/25/16 [phone]

## 2016-07-25 NOTE — ED Triage Notes (Addendum)
Pt presents to ED 13-[redacted] weeks pregnant from home c/o SOB constantly  at times x3 days.  "I keep losing my breath where I can't talk". "I have been having bad headaches due to a knot on my head; I feel like I am about to pass out". Pt alert and oriented x4

## 2016-10-11 NOTE — L&D Delivery Note (Signed)
Date of delivery: 01/30/2017 Estimated Date of Delivery: 01/31/17 EGA: [redacted]w[redacted]d  Delivery Note At 6:21 PM a viable female was delivered via Vaginal, Spontaneous Delivery (Presentation: OA;  ROA).  APGAR: 8, 9; weight 7 lb 11.1 oz (3490 g).   Placenta status: spontaneous, intact.  Cord:  with the following complications: none.  Cord pH: NA  Called to see patient.  Mom pushed to deliver a viable female infant.  The head followed by shoulders, which delivered without difficulty, and the rest of the body.  No nuchal cord noted.  Baby to mom's chest briefly and then taken to warmer following clamping and cutting per mom's preference.  Cord clamped and cut after  1 min delay.  Cord blood obtained.  Placenta delivered spontaneously, intact, with a 3-vessel cord.  Perineum intact, no lacerations.  All counts correct.  Hemostasis obtained with IV pitocin and fundal massage.   Anesthesia: epidural  Episiotomy: None Lacerations: None Suture Repair: NA Est. Blood Loss (mL):  200  Mom to postpartum.  Baby to Couplet care / Skin to Skin.  Tresea Mall, CNM 01/30/2017, 6:43 PM

## 2016-12-09 ENCOUNTER — Encounter: Payer: Self-pay | Admitting: Advanced Practice Midwife

## 2016-12-09 LAB — TRICH VAG BY NAA

## 2016-12-14 ENCOUNTER — Inpatient Hospital Stay
Admission: EM | Admit: 2016-12-14 | Discharge: 2016-12-14 | Disposition: A | Discharge status: Home / Self Care | Payer: Medicaid Other | Attending: Certified Nurse Midwife | Admitting: Certified Nurse Midwife

## 2016-12-14 ENCOUNTER — Encounter: Payer: Self-pay | Admitting: *Deleted

## 2016-12-14 DIAGNOSIS — N3000 Acute cystitis without hematuria: Secondary | ICD-10-CM

## 2016-12-14 DIAGNOSIS — R1030 Lower abdominal pain, unspecified: Secondary | ICD-10-CM | POA: Insufficient documentation

## 2016-12-14 DIAGNOSIS — N898 Other specified noninflammatory disorders of vagina: Secondary | ICD-10-CM | POA: Insufficient documentation

## 2016-12-14 DIAGNOSIS — B3731 Acute candidiasis of vulva and vagina: Secondary | ICD-10-CM

## 2016-12-14 DIAGNOSIS — O9989 Other specified diseases and conditions complicating pregnancy, childbirth and the puerperium: Secondary | ICD-10-CM

## 2016-12-14 DIAGNOSIS — O26893 Other specified pregnancy related conditions, third trimester: Secondary | ICD-10-CM | POA: Diagnosis not present

## 2016-12-14 DIAGNOSIS — Z3A33 33 weeks gestation of pregnancy: Secondary | ICD-10-CM | POA: Insufficient documentation

## 2016-12-14 DIAGNOSIS — R109 Unspecified abdominal pain: Secondary | ICD-10-CM

## 2016-12-14 DIAGNOSIS — B373 Candidiasis of vulva and vagina: Secondary | ICD-10-CM

## 2016-12-14 LAB — URINALYSIS, ROUTINE W REFLEX MICROSCOPIC
BILIRUBIN URINE: NEGATIVE
GLUCOSE, UA: NEGATIVE mg/dL
KETONES UR: NEGATIVE mg/dL
Nitrite: NEGATIVE
PH: 7 (ref 5.0–8.0)
PROTEIN: NEGATIVE mg/dL
Specific Gravity, Urine: 1.005 (ref 1.005–1.030)

## 2016-12-14 MED ORDER — TERCONAZOLE 0.8 % VA CREA: 1.0000 | TOPICAL_CREAM | Freq: Every day | VAGINAL | 0 refills | Status: DC

## 2016-12-14 MED ORDER — CEPHALEXIN 500 MG PO CAPS
500.0000 mg | ORAL_CAPSULE | Freq: Three times a day (TID) | ORAL | Status: DC
  Administered 2016-12-14: 500 mg via ORAL
  Filled 2016-12-14 (×2): qty 1

## 2016-12-14 MED ORDER — CEPHALEXIN 500 MG PO CAPS: 500.0000 mg | ORAL_CAPSULE | Freq: Three times a day (TID) | ORAL | 0 refills | Status: DC

## 2016-12-14 NOTE — Final Progress Note (Signed)
Physician Final Progress Note  Patient ID: Jamie Gentry D Brayman MRN: [id number] DOB/AGE: [birth year] 20 y.o.  Admit date: 12/14/2016 Admitting provider: Nadara Mustardobert P Harris, MD Discharge date: 12/14/2016   Admission Diagnoses: abdominal pain and vaginal discharge Pregnancy at 33wk1d  Discharge Diagnoses: IUP at 33wk1d Probable UTI Monilial vulvovaginitis  Consults: none  Significant Findings/ Diagnostic Studies: 21 yo G2 P1001 with EDC=01/31/2017 by LMP=04/26/2017 and c/w a 13 week ultrasound presented to L&D at 33wk1 d with lower abdominal pain (rates pain 8/10) and yellow vaginal discharge x2 days. Baby active. [facility]. Review of Systems  Constitutional: Negative for chills and fever.  Gastrointestinal: Positive for abdominal pain. Negative for constipation, diarrhea, nausea and vomiting.       Negative for contractions  Genitourinary: Positive for frequency and urgency. Negative for dysuria and hematuria.       Voiding small amts. Had some blood spots in vaginal discharge 2 days ago. No vulvar itching  Musculoskeletal: Positive for back pain.       Pain in left SI joint extending into buttock  EXAm: BP 117/74 (BP Location: Left Arm)   Pulse 96   Temp 98.8 F (37.1 C) (Oral)   Resp 18   LMP 04/26/2016   General: eating a cheese burger shortly after arrival; gravid BF in NAD, smiling Abdomen: soft, gravid, NT FHT: 135-140 baseline with accelerations to 150s, moderate variability Toco: essentially acontractile Pelvic exam: Vulva: slightly inflamed introitus, thick white discharge at introitus Vagina: white-yellow thick discharge Wet prep: positive hyphae, negative Trich or clue cells Cervix: L/TH/CL/posterior  Back: mild tenderness in left SI joint  Results for orders placed or performed during the hospital encounter of 12/14/16 (from the past 24 hour(s))  Urinalysis, Routine w reflex microscopic     Status: Abnormal   Collection Time: 12/14/16  6:47 PM  Result Value  Ref Range   Color, Urine YELLOW (A) YELLOW   APPearance HAZY (A) CLEAR   Specific Gravity, Urine 1.005 1.005 - 1.030   pH 7.0 5.0 - 8.0   Glucose, UA NEGATIVE NEGATIVE mg/dL   Hgb urine dipstick SMALL (A) NEGATIVE   Bilirubin Urine NEGATIVE NEGATIVE   Ketones, ur NEGATIVE NEGATIVE mg/dL   Protein, ur NEGATIVE NEGATIVE mg/dL   Nitrite NEGATIVE NEGATIVE   Leukocytes, UA LARGE (A) NEGATIVE   RBC / HPF 6-30 0 - 5 RBC/hpf   WBC, UA 6-30 0 - 5 WBC/hpf   Bacteria, UA RARE (A) NONE SEEN   Squamous Epithelial / LPF 6-30 (A) NONE SEEN   Mucous PRESENT     A: IUP at 33wk1d with possible UTI Monilial vulvovaginitis FWB- cat 1 tracing  P: urine culture pending Keflex 500 mgm tid-first dose given here Increase water intake Terazol 3 cream-insert one applicatorful qhs x3 Call to reschedule ROB visit  Procedures:none  Discharge Condition: stable  Disposition: 01-Home or Self Care  Diet: Regular diet  Discharge Activity: Activity as tolerated  Discharge Instructions    Discharge patient    Complete by:  As directed    Discharge disposition:  01-Home or Self Care   Discharge patient date:  12/14/2016     Allergies as of 12/14/2016   No Known Allergies     Medication List    TAKE these medications   cephALEXin 500 MG capsule Commonly known as:  KEFLEX Take 1 capsule (500 mg total) by mouth every 8 (eight) hours.   multivitamin-prenatal 27-0.8 MG Tabs tablet Take 1 tablet by mouth daily at 12  noon.   terconazole 0.8 % vaginal cream Commonly known as:  TERAZOL 3 Place 1 applicator vaginally at bedtime.        Total time spent taking care of this patient: 20 minutes  Signed: Farrel Conners 12/14/2016, 8:52 PM

## 2016-12-14 NOTE — OB Triage Note (Signed)
Patient states she starting hurting a few days ago in her lower abdomen and then today started hurting her her back as well. Pt also states she has been leaking fluid on and off for the past few days as well. Denies any vaginal discharge or bleeding. No problems voiding. Pt states baby is moving well.

## 2016-12-15 NOTE — Progress Notes (Signed)
This encounter was created in error - please disregard.

## 2016-12-16 LAB — URINE CULTURE: Special Requests: NORMAL

## 2016-12-31 ENCOUNTER — Observation Stay
Admission: EM | Admit: 2016-12-31 | Discharge: 2017-01-01 | Disposition: A | Discharge status: Home / Self Care | Payer: Medicaid Other | Attending: Obstetrics & Gynecology | Admitting: Obstetrics & Gynecology

## 2016-12-31 DIAGNOSIS — Z3A35 35 weeks gestation of pregnancy: Secondary | ICD-10-CM | POA: Insufficient documentation

## 2016-12-31 DIAGNOSIS — M7989 Other specified soft tissue disorders: Secondary | ICD-10-CM | POA: Diagnosis present

## 2016-12-31 DIAGNOSIS — Z87891 Personal history of nicotine dependence: Secondary | ICD-10-CM | POA: Insufficient documentation

## 2016-12-31 DIAGNOSIS — O26893 Other specified pregnancy related conditions, third trimester: Secondary | ICD-10-CM | POA: Diagnosis not present

## 2016-12-31 DIAGNOSIS — R51 Headache: Secondary | ICD-10-CM | POA: Diagnosis not present

## 2016-12-31 LAB — COMPREHENSIVE METABOLIC PANEL
ALT: 18 U/L (ref 14–54)
ANION GAP: 6 (ref 5–15)
AST: 22 U/L (ref 15–41)
Albumin: 3.3 g/dL — ABNORMAL LOW (ref 3.5–5.0)
Alkaline Phosphatase: 98 U/L (ref 38–126)
BILIRUBIN TOTAL: 0.8 mg/dL (ref 0.3–1.2)
BUN: 6 mg/dL (ref 6–20)
CHLORIDE: 108 mmol/L (ref 101–111)
CO2: 21 mmol/L — ABNORMAL LOW (ref 22–32)
Calcium: 8.7 mg/dL — ABNORMAL LOW (ref 8.9–10.3)
Creatinine, Ser: 0.51 mg/dL (ref 0.44–1.00)
Glucose, Bld: 82 mg/dL (ref 65–99)
POTASSIUM: 3.4 mmol/L — AB (ref 3.5–5.1)
Sodium: 135 mmol/L (ref 135–145)
Total Protein: 6.4 g/dL — ABNORMAL LOW (ref 6.5–8.1)

## 2016-12-31 LAB — CBC
HCT: 29.5 % — ABNORMAL LOW (ref 35.0–47.0)
Hemoglobin: 10.2 g/dL — ABNORMAL LOW (ref 12.0–16.0)
MCH: 30.3 pg (ref 26.0–34.0)
MCHC: 34.8 g/dL (ref 32.0–36.0)
MCV: 87.1 fL (ref 80.0–100.0)
PLATELETS: 175 10*3/uL (ref 150–440)
RBC: 3.38 MIL/uL — ABNORMAL LOW (ref 3.80–5.20)
RDW: 12.9 % (ref 11.5–14.5)
WBC: 8.8 10*3/uL (ref 3.6–11.0)

## 2017-01-01 DIAGNOSIS — Z3A35 35 weeks gestation of pregnancy: Secondary | ICD-10-CM | POA: Diagnosis not present

## 2017-01-01 DIAGNOSIS — O26893 Other specified pregnancy related conditions, third trimester: Secondary | ICD-10-CM | POA: Diagnosis not present

## 2017-01-01 DIAGNOSIS — R609 Edema, unspecified: Secondary | ICD-10-CM | POA: Diagnosis not present

## 2017-01-01 LAB — PROTEIN / CREATININE RATIO, URINE
Creatinine, Urine: 167 mg/dL
PROTEIN CREATININE RATIO: 0.1 mg/mg{creat} (ref 0.00–0.15)
Total Protein, Urine: 16 mg/dL

## 2017-01-01 NOTE — Discharge Instructions (Signed)
Discharge teaching complete, all questions answered °

## 2017-01-01 NOTE — Discharge Summary (Signed)
Physician Final Progress Note  Patient ID: SMT. LODER MRN: [id number] DOB/AGE: July 20, 1996 21 y.o.  Admit date: 12/31/2016 Admitting provider: Rod Can, CNM Discharge date: 01/01/2017   Admission Diagnoses: swollen hands  Discharge Diagnoses:  Active Problems:   Indication for care in labor and delivery, antepartum IUP at 37w5dwith reactive NST, normal PIH labs and normal blood pressure  History of Present Illness: The patient is a 21y.o. female G2P1001 at 312w5dho presents for swelling in her hands that has been present for the last few days. She has had a headache in the past few days but did not try anything for analgesia. The headache has improved. She states she has had some visual spots. She admits positive fetal movement. She denies contractions, LOF, VB. She admits to inadequate hydration with h2o. Patient was admitted for observation, placed on monitors, labs drawn.  Past Medical History:  Diagnosis Date  . Trichomoniasis 07/15/2016    History reviewed. No pertinent surgical history.  No current facility-administered medications on file prior to encounter.    Current Outpatient Prescriptions on File Prior to Encounter  Medication Sig Dispense Refill  . Prenatal Vit-Fe Fumarate-FA (MULTIVITAMIN-PRENATAL) 27-0.8 MG TABS tablet Take 1 tablet by mouth daily at 12 noon.    . cephALEXin (KEFLEX) 500 MG capsule Take 1 capsule (500 mg total) by mouth every 8 (eight) hours. (Patient not taking: Reported on 12/31/2016) 20 capsule 0  . terconazole (TERAZOL 3) 0.8 % vaginal cream Place 1 applicator vaginally at bedtime. (Patient not taking: Reported on 12/31/2016) 20 g 0    No Known Allergies  Social History   Social History  . Marital status: Single    Spouse name: N/A  . Number of children: N/A  . Years of education: N/A   Occupational History  . Not on file.   Social History Main Topics  . Smoking status: Former SmResearch scientist (life sciences). Smokeless tobacco: Never Used  .  Alcohol use No  . Drug use: No  . Sexual activity: Yes     Comment: unsure   Other Topics Concern  . Not on file   Social History Narrative  . No narrative on file    Physical Exam: BP (!) 122/55   Pulse (!) 103   Temp 97.6 F (36.4 C) (Oral)   LMP 04/26/2016   Gen: NAD CV: RRR Pulm: CTAB Pelvic: deferred Toco: negative Fetal Well Being: 130 bpm, moderate variability, +accelerations, -decelerations Ext: no evidence of DVT  Consults: None  Significant Findings/ Diagnostic Studies: labs:   Results for SUSENDY, PLUTAMRN [id number] of 01/01/2017 00:22  Ref. Range 12/31/2016 23:08  Sodium Latest Ref Range: 135 - 145 mmol/L 135  Potassium Latest Ref Range: 3.5 - 5.1 mmol/L 3.4 (L)  Chloride Latest Ref Range: 101 - 111 mmol/L 108  CO2 Latest Ref Range: 22 - 32 mmol/L 21 (L)  Glucose Latest Ref Range: 65 - 99 mg/dL 82  BUN Latest Ref Range: 6 - 20 mg/dL 6  Creatinine Latest Ref Range: 0.44 - 1.00 mg/dL 0.51  Calcium Latest Ref Range: 8.9 - 10.3 mg/dL 8.7 (L)  Anion gap Latest Ref Range: 5 - 15  6  Alkaline Phosphatase Latest Ref Range: 38 - 126 U/L 98  Albumin Latest Ref Range: 3.5 - 5.0 g/dL 3.3 (L)  AST Latest Ref Range: 15 - 41 U/L 22  ALT Latest Ref Range: 14 - 54 U/L 18  Total Protein Latest Ref Range: 6.5 - 8.1 g/dL 6.4 (  L)  Total Bilirubin Latest Ref Range: 0.3 - 1.2 mg/dL 0.8  EGFR (African American) Latest Ref Range: >60 mL/min >60  EGFR (Non-African Amer.) Latest Ref Range: >60 mL/min >60  WBC Latest Ref Range: 3.6 - 11.0 K/uL 8.8  RBC Latest Ref Range: 3.80 - 5.20 MIL/uL 3.38 (L)  Hemoglobin Latest Ref Range: 12.0 - 16.0 g/dL 10.2 (L)  HCT Latest Ref Range: 35.0 - 47.0 % 29.5 (L)  MCV Latest Ref Range: 80.0 - 100.0 fL 87.1  MCH Latest Ref Range: 26.0 - 34.0 pg 30.3  MCHC Latest Ref Range: 32.0 - 36.0 g/dL 34.8  RDW Latest Ref Range: 11.5 - 14.5 % 12.9  Platelets Latest Ref Range: 150 - 440 K/uL 175    Procedures: NST  Discharge Condition:  good  Disposition: 01-Home or Self Care  Diet: Regular diet, decrease sodium, increase hydration with h20  Discharge Activity: Activity as tolerated  Discharge Instructions    Discharge activity:  No Restrictions    Complete by:  As directed    Discharge diet:  No restrictions    Complete by:  As directed    Fetal Kick Count:  Lie on our left side for one hour after a meal, and count the number of times your baby kicks.  If it is less than 5 times, get up, move around and drink some juice.  Repeat the test 30 minutes later.  If it is still less than 5 kicks in an hour, notify your doctor.    Complete by:  As directed    No sexual activity restrictions    Complete by:  As directed    Notify physician for a general feeling that "something is not right"    Complete by:  As directed    Notify physician for increase or change in vaginal discharge    Complete by:  As directed    Notify physician for intestinal cramps, with or without diarrhea, sometimes described as "gas pain"    Complete by:  As directed    Notify physician for leaking of fluid    Complete by:  As directed    Notify physician for low, dull backache, unrelieved by heat or Tylenol    Complete by:  As directed    Notify physician for menstrual like cramps    Complete by:  As directed    Notify physician for pelvic pressure    Complete by:  As directed    Notify physician for uterine contractions.  These may be painless and feel like the uterus is tightening or the baby is  "balling up"    Complete by:  As directed    Notify physician for vaginal bleeding    Complete by:  As directed    PRETERM LABOR:  Includes any of the follwing symptoms that occur between 20 - [redacted] weeks gestation.  If these symptoms are not stopped, preterm labor can result in preterm delivery, placing your baby at risk    Complete by:  As directed      Allergies as of 01/01/2017   No Known Allergies     Medication List    STOP taking these  medications   cephALEXin 500 MG capsule Commonly known as:  KEFLEX   terconazole 0.8 % vaginal cream Commonly known as:  TERAZOL 3     TAKE these medications   multivitamin-prenatal 27-0.8 MG Tabs tablet Take 1 tablet by mouth daily at 12 noon.      Follow-up Information  [facility] Follow up.   Why:  go to regular scheduled prenatal appointment Contact information: [address] [place] [address] [phone]          Total time spent taking care of this patient: 20 minutes  Signed: Rod Can, CNM  01/01/2017, 12:16 AM

## 2017-01-24 ENCOUNTER — Ambulatory Visit (INDEPENDENT_AMBULATORY_CARE_PROVIDER_SITE_OTHER): Payer: Medicaid Other | Admitting: Advanced Practice Midwife

## 2017-01-24 ENCOUNTER — Observation Stay
Admission: EM | Admit: 2017-01-24 | Discharge: 2017-01-24 | Disposition: A | Discharge status: Home / Self Care | Payer: Medicaid Other | Attending: Certified Nurse Midwife | Admitting: Certified Nurse Midwife

## 2017-01-24 VITALS — BP 130/80 | Wt 215.0 lb

## 2017-01-24 DIAGNOSIS — R109 Unspecified abdominal pain: Secondary | ICD-10-CM

## 2017-01-24 DIAGNOSIS — O471 False labor at or after 37 completed weeks of gestation: Secondary | ICD-10-CM

## 2017-01-24 DIAGNOSIS — Z3A39 39 weeks gestation of pregnancy: Secondary | ICD-10-CM | POA: Diagnosis not present

## 2017-01-24 DIAGNOSIS — O26899 Other specified pregnancy related conditions, unspecified trimester: Secondary | ICD-10-CM | POA: Diagnosis present

## 2017-01-24 DIAGNOSIS — O26893 Other specified pregnancy related conditions, third trimester: Secondary | ICD-10-CM

## 2017-01-24 LAB — CHLAMYDIA/NGC RT PCR (ARMC ONLY)
Chlamydia Tr: NOT DETECTED
N GONORRHOEAE: NOT DETECTED

## 2017-01-24 LAB — OB RESULTS CONSOLE GBS: STREP GROUP B AG: NEGATIVE

## 2017-01-24 NOTE — Final Progress Note (Signed)
Physician Final Progress Note  Patient ID: SERENE KOPF MRN: [id number] DOB/AGE: 11/21/1995 21 y.o.  Admit date: 01/24/2017 Admitting provider: Vena Austria, MD Discharge date: 01/24/2017   Admission Diagnoses: IUP at 39 weeks with contractions.  Discharge Diagnoses:   IUP at 39 weeks with BH contractions  Consults: none  Significant Findings/ Diagnostic Studies: 21 year old G2 P1001 with EDC=01/31/2017 by LMP=04/26/2017 and c/w a 13 week ultrasound presented to L&D with complaints of contractions every 10-15 min apart since 2200 last night. No vaginal bleeding or leakage of fluid. Prenatal care at [facility], but has not been seen in the office since mid February. GBS and Aptima have not been done.  Exam: General: looks very comfortable, texting on phone BP 114/68 (BP Location: Left Arm)   Pulse 94   Temp 98.7 F (37.1 C) (Oral)   Resp 16   Ht  (1.6 m)   Wt 93 kg (205 lb)   LMP 04/26/2016   BMI 36.31 kg/m   FHR: 135 with accelerations to 150s, mod varaibility Toco: occasional, mild contraction Abdomen: soft, NT, gravid, cephalic on Leopold's Cervix: FT/ thick/ posterior/ -1 GBS and Aptima done  A: IUP at 39 weeks, not in labor FWB: Cat 1 tracing  P: DC home To follow up in office for routine exam Labor precautions  Procedures: Non stress test reactive  Discharge Condition: stable  Disposition: 01-Home or Self Care  Diet: Regular diet  Discharge Activity: Activity as tolerated  Discharge Instructions    Discharge patient    Complete by:  As directed    Discharge disposition:  01-Home or Self Care   Discharge patient date:  01/24/2017     Allergies as of 01/24/2017   No Known Allergies     Medication List    ASK your doctor about these medications   multivitamin-prenatal 27-0.8 MG Tabs tablet Take 1 tablet by mouth daily at 12 noon.      Follow-up Information    [facility]. Go today.   Why:  Appointment is at 2:50  PM. Contact information: [address] [place] [address] [phone]          Total time spent taking care of this patient: 15 minutes  Signed: Farrel Conners 01/24/2017, 8:42 PM

## 2017-01-24 NOTE — Progress Notes (Signed)
Cervix is posterior difficult to assess. Reviewed labor precautions and postdates protocol.

## 2017-01-24 NOTE — OB Triage Note (Signed)
Pt [redacted]w[redacted]d G2P1 complains of ctx q 10-15 m apart since 2200 01/23/17. Pt states pain gets to 8/10 with ctx. Pt states no n/v/d. Pt states + FM and denies leaking of fluid, vaginal bleeding, and vaginal discharge. VSS. Monitors applied and assessing pt.

## 2017-01-27 LAB — CULTURE, BETA STREP (GROUP B ONLY): SPECIAL REQUESTS: NORMAL

## 2017-01-29 ENCOUNTER — Encounter: Payer: Self-pay | Admitting: *Deleted

## 2017-01-29 ENCOUNTER — Observation Stay
Admission: EM | Admit: 2017-01-29 | Discharge: 2017-01-29 | Disposition: A | Discharge status: Home / Self Care | Payer: Medicaid Other | Source: Home / Self Care | Admitting: Obstetrics and Gynecology

## 2017-01-29 DIAGNOSIS — O4693 Antepartum hemorrhage, unspecified, third trimester: Secondary | ICD-10-CM

## 2017-01-29 DIAGNOSIS — Z3A39 39 weeks gestation of pregnancy: Secondary | ICD-10-CM

## 2017-01-29 DIAGNOSIS — O479 False labor, unspecified: Secondary | ICD-10-CM | POA: Diagnosis not present

## 2017-01-29 NOTE — Discharge Instructions (Signed)
Please keep next scheduled appointment at [facility] on April 24th.  If you have any questions or concerns please call the on-call provider.  You may also call the nurse's desk at the [facility] at [phone] with questions.  If you have any urgent concerns please go to the nearest Emergency department for evaluation.

## 2017-01-29 NOTE — Discharge Summary (Signed)
Physician Final Progress Note  Patient ID: Jamie Gentry MRN: [id number] DOB/AGE: [birth year] 21 y.o.  Admit date: 01/29/2017 Admitting provider: Tresea Mall, CNM Discharge date: 01/29/2017   Admission Diagnoses: abdominal pain, mucous/blood vaginal discharge  Discharge Diagnoses:  Active Problems:   Indication for care in labor and delivery, antepartum IUP at [redacted]w[redacted]d with reactive NST, not in labor  History of Present Illness: The patient is a 21 y.o. female G2P1001 at [redacted]w[redacted]d who presents for abdominal pain that comes and goes every 15 minutes since last night and mucous with blood in it discharge this morning.   Past Medical History:  Diagnosis Date  . Trichomoniasis 07/15/2016    History reviewed. No pertinent surgical history.  No current facility-administered medications on file prior to encounter.    Current Outpatient Prescriptions on File Prior to Encounter  Medication Sig Dispense Refill  . Prenatal Vit-Fe Fumarate-FA (MULTIVITAMIN-PRENATAL) 27-0.8 MG TABS tablet Take 1 tablet by mouth daily at 12 noon.      No Known Allergies  Social History   Social History  . Marital status: Single    Spouse name: N/A  . Number of children: N/A  . Years of education: N/A   Occupational History  . Not on file.   Social History Main Topics  . Smoking status: Former Games developer  . Smokeless tobacco: Never Used  . Alcohol use No  . Drug use: No  . Sexual activity: Yes     Comment: unsure   Other Topics Concern  . Not on file   Social History Narrative  . No narrative on file    Physical Exam: BP 133/66   Pulse 90   Temp 98.3 F (36.8 C) (Oral)   Resp 16   Ht  (1.6 m)   Wt 210 lb (95.3 kg)   LMP 04/26/2016   BMI 37.20 kg/m   Gen: NAD CV: RRR Pulm: CTAB Pelvic: 2/50/-3 per RN exam on admission and no change prior to discharge Toco: irritability Fetal Well Being: 130 bpm, moderate variability, +accels, -decels Ext: trace to +1 edema in  feet/ankles  Consults: None  Significant Findings/ Diagnostic Studies: none  Procedures: NST  Discharge Condition: good  Disposition: 01-Home or Self Care  Diet: Regular diet  Discharge Activity: Activity as tolerated  Discharge Instructions    Discharge activity:  No Restrictions    Complete by:  As directed    Discharge diet:  No restrictions    Complete by:  As directed    Fetal Kick Count:  Lie on our left side for one hour after a meal, and count the number of times your baby kicks.  If it is less than 5 times, get up, move around and drink some juice.  Repeat the test 30 minutes later.  If it is still less than 5 kicks in an hour, notify your doctor.    Complete by:  As directed    LABOR:  When conractions begin, you should start to time them from the beginning of one contraction to the beginning  of the next.  When contractions are 5 - 10 minutes apart or less and have been regular for at least an hour, you should call your health care provider.    Complete by:  As directed    No sexual activity restrictions    Complete by:  As directed    Notify physician for bleeding from the vagina    Complete by:  As directed    Notify physician  for blurring of vision or spots before the eyes    Complete by:  As directed    Notify physician for chills or fever    Complete by:  As directed    Notify physician for fainting spells, "black outs" or loss of consciousness    Complete by:  As directed    Notify physician for increase in vaginal discharge    Complete by:  As directed    Notify physician for leaking of fluid    Complete by:  As directed    Notify physician for pain or burning when urinating    Complete by:  As directed    Notify physician for pelvic pressure (sudden increase)    Complete by:  As directed    Notify physician for severe or continued nausea or vomiting    Complete by:  As directed    Notify physician for sudden gushing of fluid from the vagina (with or  without continued leaking)    Complete by:  As directed    Notify physician for sudden, constant, or occasional abdominal pain    Complete by:  As directed    Notify physician if baby moving less than usual    Complete by:  As directed      Allergies as of 01/29/2017   No Known Allergies     Medication List    TAKE these medications   multivitamin-prenatal 27-0.8 MG Tabs tablet Take 1 tablet by mouth daily at 12 noon.      Follow-up Information    [facility] Follow up.   Why:  go to regular scheduled prenatal appointment Contact information: [address] [place] [address] [phone]          Total time spent taking care of this patient: 15 minutes  Signed: Tresea Mall, CNM  01/29/2017, 6:46 PM

## 2017-01-30 ENCOUNTER — Inpatient Hospital Stay: Payer: Medicaid Other | Admitting: Anesthesiology

## 2017-01-30 ENCOUNTER — Inpatient Hospital Stay
Admission: AD | Admit: 2017-01-30 | Discharge: 2017-02-01 | DRG: 775 | Disposition: A | Discharge status: Home / Self Care | Payer: Medicaid Other | Source: Intra-hospital | Attending: Obstetrics and Gynecology | Admitting: Obstetrics and Gynecology

## 2017-01-30 ENCOUNTER — Encounter: Payer: Self-pay | Admitting: *Deleted

## 2017-01-30 DIAGNOSIS — Z87891 Personal history of nicotine dependence: Secondary | ICD-10-CM

## 2017-01-30 DIAGNOSIS — O99214 Obesity complicating childbirth: Secondary | ICD-10-CM | POA: Diagnosis present

## 2017-01-30 DIAGNOSIS — Z3A39 39 weeks gestation of pregnancy: Secondary | ICD-10-CM

## 2017-01-30 DIAGNOSIS — E669 Obesity, unspecified: Secondary | ICD-10-CM | POA: Diagnosis present

## 2017-01-30 DIAGNOSIS — Z68.41 Body mass index (BMI) pediatric, 85th percentile to less than 95th percentile for age: Secondary | ICD-10-CM | POA: Diagnosis not present

## 2017-01-30 DIAGNOSIS — Z3493 Encounter for supervision of normal pregnancy, unspecified, third trimester: Secondary | ICD-10-CM | POA: Diagnosis present

## 2017-01-30 LAB — TYPE AND SCREEN
ABO/RH(D): O POS
ANTIBODY SCREEN: NEGATIVE

## 2017-01-30 LAB — CBC
HEMATOCRIT: 31.3 % — AB (ref 35.0–47.0)
HEMOGLOBIN: 10.6 g/dL — AB (ref 12.0–16.0)
MCH: 29 pg (ref 26.0–34.0)
MCHC: 34 g/dL (ref 32.0–36.0)
MCV: 85.4 fL (ref 80.0–100.0)
PLATELETS: 187 10*3/uL (ref 150–440)
RBC: 3.66 MIL/uL — ABNORMAL LOW (ref 3.80–5.20)
RDW: 13.5 % (ref 11.5–14.5)
WBC: 10 10*3/uL (ref 3.6–11.0)

## 2017-01-30 MED ORDER — ONDANSETRON HCL 4 MG/2ML IJ SOLN
4.0000 mg | INTRAMUSCULAR | Status: DC | PRN
Start: 2017-01-30 — End: 2017-02-01
  Administered 2017-01-30: 4 mg via INTRAVENOUS
  Filled 2017-01-30: qty 2

## 2017-01-30 MED ORDER — LIDOCAINE HCL (PF) 1 % IJ SOLN: 30.0000 mL | INTRAMUSCULAR | Status: DC | PRN

## 2017-01-30 MED ORDER — LACTATED RINGERS IV SOLN
INTRAVENOUS | Status: DC
  Administered 2017-01-30 (×2): via INTRAVENOUS

## 2017-01-30 MED ORDER — BENZOCAINE-MENTHOL 20-0.5 % EX AERO: 1.0000 "application " | INHALATION_SPRAY | CUTANEOUS | Status: DC | PRN

## 2017-01-30 MED ORDER — LIDOCAINE HCL (PF) 1 % IJ SOLN
INTRAMUSCULAR | Status: DC | PRN
  Administered 2017-01-30: 1.2 mL

## 2017-01-30 MED ORDER — OXYTOCIN 10 UNIT/ML IJ SOLN
INTRAMUSCULAR | Status: AC
  Filled 2017-01-30: qty 2

## 2017-01-30 MED ORDER — DIPHENHYDRAMINE HCL 25 MG PO CAPS: 25.0000 mg | ORAL_CAPSULE | Freq: Four times a day (QID) | ORAL | Status: DC | PRN

## 2017-01-30 MED ORDER — WITCH HAZEL-GLYCERIN EX PADS: 1.0000 "application " | MEDICATED_PAD | CUTANEOUS | Status: DC | PRN

## 2017-01-30 MED ORDER — OXYTOCIN BOLUS FROM INFUSION
500.0000 mL | Freq: Once | INTRAVENOUS | Status: AC
  Administered 2017-01-30: 500 mL via INTRAVENOUS

## 2017-01-30 MED ORDER — ONDANSETRON HCL 4 MG PO TABS: 4.0000 mg | ORAL_TABLET | ORAL | Status: DC | PRN

## 2017-01-30 MED ORDER — ACETAMINOPHEN 325 MG PO TABS
650.0000 mg | ORAL_TABLET | ORAL | Status: DC | PRN
  Administered 2017-01-31: 650 mg via ORAL
  Filled 2017-01-30: qty 2

## 2017-01-30 MED ORDER — MISOPROSTOL 200 MCG PO TABS
ORAL_TABLET | ORAL | Status: AC
  Filled 2017-01-30: qty 4

## 2017-01-30 MED ORDER — FENTANYL 2.5 MCG/ML W/ROPIVACAINE 0.2% IN NS 100 ML EPIDURAL INFUSION (ARMC-ANES)
EPIDURAL | Status: AC
  Filled 2017-01-30: qty 100

## 2017-01-30 MED ORDER — TETANUS-DIPHTH-ACELL PERTUSSIS 5-2.5-18.5 LF-MCG/0.5 IM SUSP: 0.5000 mL | Freq: Once | INTRAMUSCULAR | Status: DC

## 2017-01-30 MED ORDER — FENTANYL 2.5 MCG/ML W/ROPIVACAINE 0.2% IN NS 100 ML EPIDURAL INFUSION (ARMC-ANES)
10.0000 mL/h | EPIDURAL | Status: DC
  Filled 2017-01-30: qty 100

## 2017-01-30 MED ORDER — DIPHENHYDRAMINE HCL 50 MG/ML IJ SOLN: 12.5000 mg | INTRAMUSCULAR | Status: DC | PRN

## 2017-01-30 MED ORDER — SIMETHICONE 80 MG PO CHEW: 80.0000 mg | CHEWABLE_TABLET | ORAL | Status: DC | PRN

## 2017-01-30 MED ORDER — LACTATED RINGERS IV SOLN: 500.0000 mL | INTRAVENOUS | Status: DC | PRN

## 2017-01-30 MED ORDER — ONDANSETRON HCL 4 MG/2ML IJ SOLN: 4.0000 mg | Freq: Four times a day (QID) | INTRAMUSCULAR | Status: DC | PRN

## 2017-01-30 MED ORDER — LACTATED RINGERS IV SOLN
500.0000 mL | Freq: Once | INTRAVENOUS | Status: AC
  Administered 2017-01-30: 500 mL via INTRAVENOUS

## 2017-01-30 MED ORDER — PRENATAL MULTIVITAMIN CH
1.0000 | ORAL_TABLET | Freq: Every day | ORAL | Status: DC
  Administered 2017-01-31 – 2017-02-01 (×2): 1 via ORAL
  Filled 2017-01-30 (×2): qty 1

## 2017-01-30 MED ORDER — FENTANYL 2.5 MCG/ML W/ROPIVACAINE 0.2% IN NS 100 ML EPIDURAL INFUSION (ARMC-ANES)
EPIDURAL | Status: DC | PRN
  Administered 2017-01-30: 10 mL/h via EPIDURAL

## 2017-01-30 MED ORDER — EPHEDRINE 5 MG/ML INJ: 10.0000 mg | INTRAVENOUS | Status: DC | PRN

## 2017-01-30 MED ORDER — PHENYLEPHRINE 40 MCG/ML (10ML) SYRINGE FOR IV PUSH (FOR BLOOD PRESSURE SUPPORT): 80.0000 ug | PREFILLED_SYRINGE | INTRAVENOUS | Status: DC | PRN

## 2017-01-30 MED ORDER — DIBUCAINE 1 % RE OINT: 1.0000 "application " | TOPICAL_OINTMENT | RECTAL | Status: DC | PRN

## 2017-01-30 MED ORDER — OXYTOCIN 40 UNITS IN LACTATED RINGERS INFUSION - SIMPLE MED
INTRAVENOUS | Status: AC
  Administered 2017-01-30: 500 mL via INTRAVENOUS
  Filled 2017-01-30: qty 1000

## 2017-01-30 MED ORDER — COCONUT OIL OIL: 1.0000 "application " | TOPICAL_OIL | Status: DC | PRN

## 2017-01-30 MED ORDER — OXYTOCIN 40 UNITS IN LACTATED RINGERS INFUSION - SIMPLE MED: 2.5000 [IU]/h | INTRAVENOUS | Status: DC

## 2017-01-30 MED ORDER — LIDOCAINE HCL (PF) 1 % IJ SOLN
INTRAMUSCULAR | Status: AC
  Filled 2017-01-30: qty 30

## 2017-01-30 MED ORDER — ACETAMINOPHEN 325 MG PO TABS: 650.0000 mg | ORAL_TABLET | ORAL | Status: DC | PRN

## 2017-01-30 MED ORDER — IBUPROFEN 600 MG PO TABS
600.0000 mg | ORAL_TABLET | Freq: Four times a day (QID) | ORAL | Status: DC
  Administered 2017-01-30 – 2017-02-01 (×5): 600 mg via ORAL
  Filled 2017-01-30 (×7): qty 1

## 2017-01-30 MED ORDER — BUTORPHANOL TARTRATE 1 MG/ML IJ SOLN
1.0000 mg | INTRAMUSCULAR | Status: DC | PRN
  Administered 2017-01-30: 1 mg via INTRAVENOUS
  Filled 2017-01-30: qty 1

## 2017-01-30 MED ORDER — SENNOSIDES-DOCUSATE SODIUM 8.6-50 MG PO TABS
2.0000 | ORAL_TABLET | ORAL | Status: DC
  Administered 2017-02-01: 2 via ORAL
  Filled 2017-01-30: qty 2

## 2017-01-30 MED ORDER — AMMONIA AROMATIC IN INHA
RESPIRATORY_TRACT | Status: AC
  Filled 2017-01-30: qty 10

## 2017-01-30 NOTE — H&P (Signed)
OB History & Physical   History of Present Illness:  Chief Complaint: contractions  HPI:  Jamie Gentry is a 21 y.o. G35P1001 female at [redacted]w[redacted]d dated by LMP.  Her pregnancy has been complicated by obesity, trichomoniasis treated.    She reports contractions.   She denies leakage of fluid.   She denies vaginal bleeding.   She reports fetal movement.    Maternal Medical History:   Past Medical History:  Diagnosis Date  . Trichomoniasis 07/15/2016    History reviewed. No pertinent surgical history.  No Known Allergies  Prior to Admission medications   Medication Sig Start Date End Date Taking? Authorizing Provider  Prenatal Vit-Fe Fumarate-FA (MULTIVITAMIN-PRENATAL) 27-0.8 MG TABS tablet Take 1 tablet by mouth daily at 12 noon.    Historical Provider, MD    OB History  Gravida Para Term Preterm AB Living  0 0 1  SAB TAB Ectopic Multiple Live Births  0 0 0 0 1    # Outcome Date GA Lbr Len/2nd Weight Sex Delivery Anes PTL Lv  2 Current           1 Term     F Vag-Spont   LIV      Prenatal care site: [facility]  Social History: She  reports that she has quit smoking. She has never used smokeless tobacco. She reports that she does not drink alcohol or use drugs.  Family History: family history is not on file.  She denies a family history of gynecologic cancers  Review of Systems: Negative x 10 systems reviewed except as noted in the HPI.    Physical Exam:  Vital Signs: BP 118/67   Pulse 98   Ht  (1.6 m)   Wt 210 lb (95.3 kg)   LMP 04/26/2016   BMI 37.20 kg/m  Constitutional: Well nourished, well developed female in no acute distress.  HEENT: normal Skin: Warm and dry.  Cardiovascular: Regular rate and rhythm.   Extremity: reflexes 2+, trace edema  Respiratory: Clear to auscultation bilateral. Normal respiratory effort Abdomen: FHT present Back: no CVAT Neuro: DTRs 2+, Cranial nerves grossly intact Psych: Alert and Oriented x3. No memory  deficits. Normal mood and affect.  MS: normal gait, normal bilateral lower extremity ROM/strength/stability.  Pelvic exam:  is not limited by body habitus EGBUS: within normal limits Vagina: within normal limits and with normal mucosa, normal discharge blood in the vault Cervix: 4/60/-2   Pertinent Results:  Prenatal Labs: Blood type/Rh O positive  Antibody screen negative  Rubella Immune  Varicella Not immune    RPR Non reactive  HBsAg negative  HIV negative  GC negative  Chlamydia negative  Genetic screening 1st screen negative  1 hour GTT WNL  3 hour GTT NA  GBS negative on 4/16   Baseline FHR: 125 beats/min   Variability: moderate   Accelerations: present   Decelerations: absent Contractions: present frequency: every 2-4 Overall assessment: Category I   Assessment:  Jamie Gentry is a 21 y.o. G20P1001 female at [redacted]w[redacted]d with active labor.   Plan:  1. Admit to Labor & Delivery  2. CBC, T&S, Clrs, IVF 3. GBS negative.   4. Fetal well-being: Category I 5. Expectant management for vaginal delivery  Tresea Mall, [facility] 01/30/2017 10:03 AM

## 2017-01-30 NOTE — Progress Notes (Signed)
Patient ID: JAZZMINE KLEIMAN, female   DOB: August 24, 1996, 21 y.o.   MRN: [id number]  Labor Check  Subj:  Complaints: none, comfortable w epidural   Obj:  BP 106/90   Pulse 100   Resp 14   Ht  (1.6 m)   Wt 210 lb (95.3 kg)   LMP 04/26/2016   SpO2 98%   BMI 37.20 kg/m     Cervix: Dilation: 7 / Effacement (%): 100 / Station: -1  Baseline FHR: 140 beats/min   Variability: moderate   Accelerations: present   Decelerations: absent   AROM: clear Contractions: present  Overall assessment: category 1  A/P: 21 y.o. G2P1001 female at [redacted]w[redacted]d with labor.  1.  Labor: continue current management, AROM clear fluid 2.  FWB: reassuring, Overall assessment: category 1  3.  GBS negative  4.  Pain: epidural 5.  Recheck: prn   Thomasene Mohair, MD 01/30/2017 1:41 PM

## 2017-01-30 NOTE — Discharge Summary (Signed)
OB Discharge Summary     Patient Name: Jamie Gentry DOB: 1996-06-16 MRN: [id number]  Date of admission: 01/30/2017 Delivering provider: Tresea Mall, CNM  Date of Delivery: 01/30/2017  Date of discharge: 02/01/2017   Admitting diagnosis: Labor Intrauterine pregnancy: [redacted]w[redacted]d     Secondary diagnosis: obesity, trichomoniasis     Discharge diagnosis: Term Pregnancy Delivered                                                                                                Post partum procedures:none  Augmentation: none  Complications: None  Hospital course:  Onset of Labor With Vaginal Delivery      21 y.o. yo G2P1001 at [redacted]w[redacted]d was admitted in Active Labor on 01/30/2017.  Patient had an uncomplicated labor course as follows:  Membrane Rupture Time/Date: 1:39 PM ,01/30/2017   Pt had delivery of viable female at 6:21 PM 01/30/2017 Details of delivery can be found in a separate delivery note  Pateint had an uncomplicated postpartum course.   She is ambulating, tolerating a regular diet, passing flatus, and urinating well. She is giving formula to baby. She is undecided about contraception and options discussed with her. Patient is discharged home in stable condition on 02/01/2017.   Physical exam  Vitals:   01/31/17 1240 01/31/17 1735 01/31/17 2248 02/01/17 0700  BP: (!) 110/55 (!) 123/59  (!) 112/59  Pulse: 74 80  78  Resp: Temp: 98.2 F (36.8 C) 98.4 F (36.9 C) 98.1 F (36.7 C) 98.5 F (36.9 C)  TempSrc: Oral Oral    SpO2: 100% 99%  100%  Weight:      Height:       General: alert, cooperative and no distress Lochia: appropriate Uterine Fundus: firm Incision: N/A DVT Evaluation: No evidence of DVT seen on physical exam.  Labs: Lab Results  Component Value Date   WBC 12.7 (H) 01/31/2017   HGB 10.3 (L) 01/31/2017   HCT 30.8 (L) 01/31/2017   MCV 85.5 01/31/2017   PLT 195 01/31/2017    Discharge instruction: per After Visit Summary.  Medications:   Allergies as of 02/01/2017   No Known Allergies     Medication List    TAKE these medications   ibuprofen 600 MG tablet Commonly known as:  ADVIL,MOTRIN Take 1 tablet (600 mg total) by mouth every 6 (six) hours.   multivitamin-prenatal 27-0.8 MG Tabs tablet Take 1 tablet by mouth daily at 12 noon.       Diet: routine diet  Activity: Advance as tolerated. Pelvic rest for 6 weeks.   Outpatient follow up: Follow-up Information    Tresea Mall, CNM. Schedule an appointment as soon as possible for a visit in 6 week(s).   Specialty:  Obstetrics Why:  postpartum follow up visit Contact information: [address] [place] [address] [phone]             Postpartum contraception: TBD Rhogam Given postpartum: NA Rubella vaccine given postpartum: Rubella Immune Varicella vaccine given postpartum: Varicella Non-immune (Varicella vaccine given prior to [place])  Newborn Data: Live born female Birth  Weight: 7 lb 11.1 oz (3490 g) APGAR: 8, 9   Baby Feeding: Bottle  Disposition:home with mother  SIGNED: Thomasene Mohair, MD 02/01/2017 11:03 AM

## 2017-01-30 NOTE — Anesthesia Preprocedure Evaluation (Signed)
Anesthesia Evaluation  Patient identified by MRN, date of birth, ID band Patient awake    Reviewed: Allergy & Precautions, NPO status , Patient's Chart, lab work & pertinent test results  History of Anesthesia Complications Negative for: history of anesthetic complications  Airway Mallampati: III       Dental   Pulmonary neg pulmonary ROS, former smoker,           Cardiovascular negative cardio ROS       Neuro/Psych negative neurological ROS     GI/Hepatic negative GI ROS, Neg liver ROS,   Endo/Other  negative endocrine ROS  Renal/GU negative Renal ROS     Musculoskeletal   Abdominal (+) + obese,   Peds  Hematology negative hematology ROS (+)   Anesthesia Other Findings   Reproductive/Obstetrics (+) Pregnancy                             Anesthesia Physical Anesthesia Plan  ASA: II  Anesthesia Plan: Epidural   Post-op Pain Management:    Induction:   Airway Management Planned:   Additional Equipment:   Intra-op Plan:   Post-operative Plan:   Informed Consent: I have reviewed the patients History and Physical, chart, labs and discussed the procedure including the risks, benefits and alternatives for the proposed anesthesia with the patient or authorized representative who has indicated his/her understanding and acceptance.     Plan Discussed with:   Anesthesia Plan Comments:         Anesthesia Quick Evaluation

## 2017-01-30 NOTE — OB Triage Note (Signed)
Patient to OBS 3 via EMS.  Patient states that she lost her mucus plug and has been having contractions since 2am that are 4 minutes apart.

## 2017-01-30 NOTE — Anesthesia Procedure Notes (Signed)
Epidural Patient location during procedure: OB Start time: 01/30/2017 11:02 AM End time: 01/30/2017 11:27 AM  Staffing Performed: anesthesiologist   Preanesthetic Checklist Completed: patient identified, site marked, surgical consent, pre-op evaluation, timeout performed, IV checked, risks and benefits discussed and monitors and equipment checked  Epidural Patient position: sitting Prep: Betadine Patient monitoring: heart rate, continuous pulse ox and blood pressure Approach: midline Location: L4-L5 Injection technique: LOR saline  Needle:  Needle type: Tuohy  Needle gauge: 17 G Needle length: 9 cm and 9 Needle insertion depth: 9 cm Catheter type: closed end flexible Catheter size: 20 Guage Catheter at skin depth: 12 cm Test dose: negative and 1.5% lidocaine with Epi 1:200 K  Assessment Events: blood not aspirated, injection not painful, no injection resistance, negative IV test and no paresthesia  Additional Notes   Patient tolerated the insertion well without complications.Reason for block:procedure for pain

## 2017-01-31 ENCOUNTER — Encounter: Payer: Self-pay | Admitting: Certified Nurse Midwife

## 2017-01-31 LAB — RPR: RPR: NONREACTIVE

## 2017-01-31 LAB — CBC
HCT: 30.8 % — ABNORMAL LOW (ref 35.0–47.0)
Hemoglobin: 10.3 g/dL — ABNORMAL LOW (ref 12.0–16.0)
MCH: 28.6 pg (ref 26.0–34.0)
MCHC: 33.5 g/dL (ref 32.0–36.0)
MCV: 85.5 fL (ref 80.0–100.0)
PLATELETS: 195 10*3/uL (ref 150–440)
RBC: 3.6 MIL/uL — ABNORMAL LOW (ref 3.80–5.20)
RDW: 13.6 % (ref 11.5–14.5)
WBC: 12.7 10*3/uL — AB (ref 3.6–11.0)

## 2017-01-31 NOTE — Anesthesia Postprocedure Evaluation (Signed)
Anesthesia Post Note  Patient: Jamie Gentry  Procedure(s) Performed: * No procedures listed *  Patient location during evaluation: Mother Baby Anesthesia Type: Epidural Level of consciousness: awake and alert Pain management: pain level controlled Vital Signs Assessment: post-procedure vital signs reviewed and stable Respiratory status: spontaneous breathing, nonlabored ventilation and respiratory function stable Cardiovascular status: stable Postop Assessment: no headache, no backache and epidural receding Anesthetic complications: no     Last Vitals:  Vitals:   01/30/17 2334 01/31/17 0446  BP: 125/65 131/68  Pulse: 91 78  Resp: 20 20  Temp: 37 C 36.6 C    Last Pain:  Vitals:   01/31/17 0625  TempSrc:   PainSc: 0-No pain                 Ventura Leggitt,  Alessandra Bevels

## 2017-01-31 NOTE — Progress Notes (Signed)
Post Partum Day 1 Subjective: no complaints, up ad lib, voiding and tolerating PO  Objective: Blood pressure 128/74, pulse 80, temperature 98 F (36.7 C), temperature source Oral, resp. rate 18, height  (1.6 m), weight 95.3 kg (210 lb), last menstrual period 04/26/2016, SpO2 100 %.  Physical Exam:  General: alert, cooperative and no distress Lochia: appropriate Uterine Fundus: firm/ U+1/ deviated to right (needs to void) DVT Evaluation: No evidence of DVT seen on physical exam. Trace edema lower extremities   Recent Labs  01/30/17 1019 01/31/17 0738  HGB 10.6* 10.3*  HCT 31.3* 30.8*  WBC 10.0 12.7*  PLT 187 195    Assessment/Plan: Stable ppd#1 Continue postpartum care O POS/RI/ VNI/ GBS negative-Varivax before discharge Bottle Contraception: undecided Probable discharge in Am.     LOS: 1 day   Farrel Conners 01/31/2017, 11:37 AM

## 2017-02-01 ENCOUNTER — Encounter: Payer: Medicaid Other | Admitting: Obstetrics & Gynecology

## 2017-02-01 MED ORDER — IBUPROFEN 600 MG PO TABS: 600.0000 mg | ORAL_TABLET | Freq: Four times a day (QID) | ORAL | 0 refills | Status: DC

## 2017-02-01 NOTE — Discharge Instructions (Signed)
Please call your doctor or return to the ER if you experience any chest pains, shortness of breath, fever greater than 101, any heavy bleeding or large clots, and foul smelling vaginal discharge, any worsening abdominal pain & cramping that is not controlled by pain medication, or any signs of post partum depression.  No tampons, enemas, douches, or sexual intercourse for 6 weeks.  Also avoid tub baths, hot tubs, or swimming for 6 weeks. ° ° °Home Care Instructions for Mom ° ACTIVITY °· Gradually return to your regular activities. °· Let yourself rest. Nap while your baby sleeps. °· Avoid lifting anything that is heavier than 10 lb (4.5 kg) until your health care provider says it is okay. °· Avoid activities that take a lot of effort and energy (are strenuous) until approved by your health care provider. Walking at a slow-to-moderate pace is usually safe. °· If you had a cesarean delivery: °¨ Do not vacuum, climb stairs, or drive a car for 4-6 weeks. °¨ Have someone help you at home until you feel like you can do your usual activities yourself. °¨ Do exercises as told by your health care provider, if this applies. °VAGINAL BLEEDING °You may continue to bleed for 4-6 weeks after delivery. Over time, the amount of blood usually decreases and the color of the blood usually gets lighter. However, the flow of bright red blood may increase if you have been too active. If you need to use more than one pad in an hour because your pad gets soaked, or if you pass a large clot: °· Lie down. °· Raise your feet. °· Place a cold compress on your lower abdomen. °· Rest. °· Call your health care provider. °If you are breastfeeding, your period should return anytime between 8 weeks after delivery and the time that you stop breastfeeding. If you are not breastfeeding, your period should return 6-8 weeks after delivery. °PERINEAL CARE °The perineal area, or perineum, is the part of your body between your thighs. After delivery, this  area needs special care. Follow these instructions as told by your health care provider. °· Take warm tub baths for 15-20 minutes. °· Use medicated pads and pain-relieving sprays and creams as told. °· Do not use tampons or douches until vaginal bleeding has stopped. °· Each time you go to the bathroom: °¨ Use a peri bottle. °¨ Change your pad. °¨ Use towelettes in place of toilet paper until your stitches have healed. °· Do Kegel exercises every day. Kegel exercises help to maintain the muscles that support the vagina, bladder, and bowels. You can do these exercises while you are standing, sitting, or lying down. To do Kegel exercises: °¨ Tighten the muscles of your abdomen and the muscles that surround your birth canal. °¨ Hold for a few seconds. °¨ Relax. °¨ Repeat until you have done this 5 times in a row. °· To prevent hemorrhoids from developing or getting worse: °¨ Drink enough fluid to keep your urine clear or pale yellow. °¨ Avoid straining when having a bowel movement. °¨ Take over-the-counter medicines and stool softeners as told by your health care provider. °BREAST CARE °· Wear a tight-fitting bra. °· Avoid taking over-the-counter pain medicine for breast discomfort. °· Apply ice to the breasts to help with discomfort as needed: °¨ Put ice in a plastic bag. °¨ Place a towel between your skin and the bag. °¨ Leave the ice on for 20 minutes or as told by your health care provider. °NUTRITION °·   Eat a well-balanced diet. °· Do not try to lose weight quickly by cutting back on calories. °· Take your prenatal vitamins until your postpartum checkup or until your health care provider tells you to stop. °POSTPARTUM DEPRESSION °You may find yourself crying for no apparent reason and unable to cope with all of the changes that come with having a newborn. This mood is called postpartum depression. Postpartum depression happens because your hormone levels change after delivery. If you have postpartum depression,  get support from your partner, friends, and family. If the depression does not go away on its own after several weeks, contact your health care provider. °BREAST SELF-EXAM °Do a breast self-exam each month, at the same time of the month. If you are breastfeeding, check your breasts just after a feeding, when your breasts are less full. If you are breastfeeding and your period has started, check your breasts on day 5, 6, or 7 of your period. °Report any lumps, bumps, or discharge to your health care provider. Know that breasts are normally lumpy if you are breastfeeding. This is temporary, and it is not a health risk. °INTIMACY AND SEXUALITY °Avoid sexual activity for at least 3-4 weeks after delivery or until the brownish-red vaginal flow is completely gone. If you want to avoid pregnancy, use some form of birth control. You can get pregnant after delivery, even if you have not had your period. °SEEK MEDICAL CARE IF: °· You feel unable to cope with the changes that a child brings to your life, and these feelings do not go away after several weeks. °· You notice a lump, a bump, or discharge on your breast. °SEEK IMMEDIATE MEDICAL CARE IF: °· Blood soaks your pad in 1 hour or less. °· You have: °¨ Severe pain or cramping in your lower abdomen. °¨ A bad-smelling vaginal discharge. °¨ A fever that is not controlled by medicine. °¨ A fever, and an area of your breast is red and sore. °¨ Pain or redness in your calf. °¨ Sudden, severe chest pain. °¨ Shortness of breath. °¨ Painful or bloody urination. °¨ Problems with your vision. °· You vomit for 12 hours or longer. °· You develop a severe headache. °· You have serious thoughts about hurting yourself, your child, or anyone else. °This information is not intended to replace advice given to you by your health care provider. Make sure you discuss any questions you have with your health care provider. °Document Released: 09/24/2000 Document Revised: 03/04/2016 Document  Reviewed: 03/31/2015 °Elsevier Interactive Patient Education © 2017 [employer] ° °

## 2017-02-01 NOTE — Progress Notes (Signed)
D/C order from MD.  Reviewed d/c instructions and prescriptions with patient and answered any questions.  Patient d/c home with infant via wheelchair by nursing/auxillary. 

## 2017-03-31 ENCOUNTER — Ambulatory Visit: Payer: Medicaid Other | Admitting: Certified Nurse Midwife

## 2017-08-10 ENCOUNTER — Encounter: Payer: Self-pay | Admitting: Emergency Medicine

## 2017-08-10 ENCOUNTER — Emergency Department
Admission: EM | Admit: 2017-08-10 | Discharge: 2017-08-10 | Disposition: A | Discharge status: Home / Self Care | Payer: Medicaid Other | Attending: Emergency Medicine | Admitting: Emergency Medicine

## 2017-08-10 DIAGNOSIS — F17200 Nicotine dependence, unspecified, uncomplicated: Secondary | ICD-10-CM | POA: Insufficient documentation

## 2017-08-10 DIAGNOSIS — Z791 Long term (current) use of non-steroidal anti-inflammatories (NSAID): Secondary | ICD-10-CM | POA: Insufficient documentation

## 2017-08-10 DIAGNOSIS — J0100 Acute maxillary sinusitis, unspecified: Secondary | ICD-10-CM | POA: Diagnosis not present

## 2017-08-10 DIAGNOSIS — R51 Headache: Secondary | ICD-10-CM | POA: Diagnosis present

## 2017-08-10 MED ORDER — AMOXICILLIN-POT CLAVULANATE 875-125 MG PO TABS: 1.0000 | ORAL_TABLET | Freq: Two times a day (BID) | ORAL | 0 refills | Status: AC

## 2017-08-10 NOTE — ED Provider Notes (Signed)
[facility] Emergency Department Provider Note  ____________________________________________  Time seen: Approximately 4:00 PM  I have reviewed the triage vital signs and the nursing notes.   HISTORY  Chief Complaint Facial Pain    HPI Jamie Gentry is a 21 y.o. female presents to the emergency department with purulent rhinorrhea, maxillary sinus tenderness and malaise for approximately four days. Patient denies chest pain, chest tightness, shortness of breath, nausea, vomiting and abdominal pain. No alleviating measures have been attempted.    Past Medical History:  Diagnosis Date  . Trichomoniasis 07/15/2016    Patient Active Problem List   Diagnosis Date Noted  . Postpartum care following vaginal delivery 01/30/2017    History reviewed. No pertinent surgical history.  Prior to Admission medications   Medication Sig Start Date End Date Taking? Authorizing Provider  amoxicillin-clavulanate (AUGMENTIN) 875-125 MG tablet Take 1 tablet by mouth 2 (two) times daily. 08/10/17 08/20/17  Orvil FeilWoods, Zissel Biederman M, PA-C  ibuprofen (ADVIL,MOTRIN) 600 MG tablet Take 1 tablet (600 mg total) by mouth every 6 (six) hours. 02/01/17   Conard NovakJackson, Stephen D, MD  Prenatal Vit-Fe Fumarate-FA (MULTIVITAMIN-PRENATAL) 27-0.8 MG TABS tablet Take 1 tablet by mouth daily at 12 noon.    [provider]    Allergies Patient has no known allergies.  No family history on file.  Social History Social History  Substance Use Topics  . Smoking status: Current Some Day Smoker  . Smokeless tobacco: Never Used  . Alcohol use No     Review of Systems  Constitutional: No fever/chills. Patient has maxillary sinus tenderness.  Eyes: No visual changes. No discharge ENT: Patient has rhinorrhea.  Cardiovascular: no chest pain. Respiratory: no cough. No SOB. Gastrointestinal: No abdominal pain.  No nausea, no vomiting.  No diarrhea.  No constipation. Musculoskeletal: Negative  for musculoskeletal pain. Skin: Negative for rash, abrasions, lacerations, ecchymosis. Neurological: Patient has headache. No focal weakness or numbness.   ____________________________________________   PHYSICAL EXAM:  VITAL SIGNS: ED Triage Vitals  Enc Vitals Group     BP 08/10/17 1509 120/75     Pulse Rate 08/10/17 1509 86     Resp 08/10/17 1509 20     Temp 08/10/17 1509 98.6 F (37 C)     Temp Source 08/10/17 1509 Oral     SpO2 08/10/17 1509 99 %     Weight 08/10/17 1510 160 lb (72.6 kg)     Height 08/10/17 1510 5\' 4"  (1.626 m)     Head Circumference --      Peak Flow --      Pain Score 08/10/17 1509 10     Pain Loc --      Pain Edu? --      Excl. in GC? --      Constitutional: Alert and oriented. Well appearing and in no acute distress. Eyes: Conjunctivae are normal. PERRL. EOMI. Head: Atraumatic. Patient has maxillary sinus tenderness.  ENT:      Ears: TMs are pearly bilaterally.       Nose: No congestion/rhinnorhea.      Mouth/Throat: Mucous membranes are moist.  Hematological/Lymphatic/Immunilogical: No cervical lymphadenopathy. Cardiovascular: Normal rate, regular rhythm. Normal S1 and S2.  Good peripheral circulation. Respiratory: Normal respiratory effort without tachypnea or retractions. Lungs CTAB. Good air entry to the bases with no decreased or absent breath sounds. Skin:  Skin is warm, dry and intact. No rash noted. Psychiatric: Mood and affect are normal. Speech and behavior are normal. Patient exhibits appropriate insight  and judgement.   ____________________________________________   LABS (all labs ordered are listed, but only abnormal results are displayed)  Labs Reviewed - No data to display ____________________________________________  EKG   ____________________________________________  RADIOLOGY   No results found.  ____________________________________________    PROCEDURES  Procedure(s) performed:     Procedures    Medications - No data to display   ____________________________________________   INITIAL IMPRESSION / ASSESSMENT AND PLAN / ED COURSE  Pertinent labs & imaging results that were available during my care of the patient were reviewed by me and considered in my medical decision making (see chart for details).  Review of the [place] CSRS was performed in accordance of the [facility] prior to dispensing any controlled drugs.     Assessment and Plan: Sinusitis:  Patient presents to the emergency department with maxillary sinus tenderness, purulent rhinorrhea, and malaise. History and physical exam findings are consistent with sinusitis. Patient was discharged with Augmentin and advised to follow up with primary care as needed. All patient questions were answered.   ____________________________________________  FINAL CLINICAL IMPRESSION(S) / ED DIAGNOSES  Final diagnoses:  Acute non-recurrent maxillary sinusitis      NEW MEDICATIONS STARTED DURING THIS VISIT:  New Prescriptions   AMOXICILLIN-CLAVULANATE (AUGMENTIN) 875-125 MG TABLET    Take 1 tablet by mouth 2 (two) times daily.        This chart was dictated using voice recognition software/Dragon. Despite best efforts to proofread, errors can occur which can change the meaning. Any change was purely unintentional.    Orvil Feil, PA-C 08/10/17 1607    Dionne Bucy, MD 08/10/17 [phone]

## 2017-08-10 NOTE — ED Triage Notes (Signed)
L sided headache and sinus drainage x 4 days.

## 2017-10-11 NOTE — L&D Delivery Note (Signed)
Date of delivery: 07/24/18 Estimated Date of Delivery: 08/01/18 Patient's last menstrual period was 10/25/2017 (exact date). EGA: [redacted]w[redacted]d  Delivery Note At 9:26 PM a viable female was delivered via Vaginal, Spontaneous (Presentation: cephalic; direct OA).  APGAR: 8, 9,  Weight pending (skin to skin).   Placenta status: spontaneous, intact.  Cord: 3vv, with the following complications: none apparent.  Cord pH: not colleted  Anesthesia: none   Episiotomy: None Lacerations: None Suture Repair: n/a Est. Blood Loss (mL): 520cc  Mom presented to L&D with labor, augmented with pitocin. GBS prophylaxis given with penicillin. AROM'd bulging bag at 8cm for clear fluid. Progressed to complete, second stage: one push.  delivery of fetal head with restitution to ROT.   Anterior then posterior shoulders delivered without difficulty.  Baby placed on mom's chest, and attended to by peds.    Cord was then clamped and cut when pulseless.  Placenta spontaneously delivered, intact.   IV pitocin given for hemorrhage prophylaxis.  We sang happy birthday to baby girl (not yet named!).  Mom to postpartum.  Baby to Couplet care / Skin to Skin.  Chelsea C Ward 07/24/2018, 9:43 PM

## 2017-12-18 ENCOUNTER — Other Ambulatory Visit: Payer: Self-pay

## 2017-12-18 ENCOUNTER — Emergency Department
Admission: EM | Admit: 2017-12-18 | Discharge: 2017-12-18 | Disposition: A | Discharge status: Home / Self Care | Payer: Medicaid Other | Attending: Emergency Medicine | Admitting: Emergency Medicine

## 2017-12-18 DIAGNOSIS — F17200 Nicotine dependence, unspecified, uncomplicated: Secondary | ICD-10-CM | POA: Diagnosis not present

## 2017-12-18 DIAGNOSIS — K920 Hematemesis: Secondary | ICD-10-CM | POA: Insufficient documentation

## 2017-12-18 DIAGNOSIS — O99330 Smoking (tobacco) complicating pregnancy, unspecified trimester: Secondary | ICD-10-CM | POA: Diagnosis not present

## 2017-12-18 DIAGNOSIS — Z3A00 Weeks of gestation of pregnancy not specified: Secondary | ICD-10-CM | POA: Insufficient documentation

## 2017-12-18 DIAGNOSIS — O99619 Diseases of the digestive system complicating pregnancy, unspecified trimester: Secondary | ICD-10-CM | POA: Insufficient documentation

## 2017-12-18 DIAGNOSIS — Z3201 Encounter for pregnancy test, result positive: Secondary | ICD-10-CM | POA: Insufficient documentation

## 2017-12-18 DIAGNOSIS — K226 Gastro-esophageal laceration-hemorrhage syndrome: Secondary | ICD-10-CM | POA: Diagnosis not present

## 2017-12-18 DIAGNOSIS — Z3490 Encounter for supervision of normal pregnancy, unspecified, unspecified trimester: Secondary | ICD-10-CM | POA: Diagnosis not present

## 2017-12-18 DIAGNOSIS — O218 Other vomiting complicating pregnancy: Secondary | ICD-10-CM | POA: Diagnosis not present

## 2017-12-18 LAB — URINALYSIS, COMPLETE (UACMP) WITH MICROSCOPIC
Bacteria, UA: NONE SEEN
Bilirubin Urine: NEGATIVE
GLUCOSE, UA: NEGATIVE mg/dL
HGB URINE DIPSTICK: NEGATIVE
Ketones, ur: NEGATIVE mg/dL
Leukocytes, UA: NEGATIVE
NITRITE: NEGATIVE
PH: 6 (ref 5.0–8.0)
PROTEIN: NEGATIVE mg/dL
Specific Gravity, Urine: 1.014 (ref 1.005–1.030)

## 2017-12-18 LAB — COMPREHENSIVE METABOLIC PANEL
ALK PHOS: 60 U/L (ref 38–126)
ALT: 9 U/L — ABNORMAL LOW (ref 14–54)
ANION GAP: 7 (ref 5–15)
AST: 16 U/L (ref 15–41)
Albumin: 4.1 g/dL (ref 3.5–5.0)
BILIRUBIN TOTAL: 0.7 mg/dL (ref 0.3–1.2)
BUN: 8 mg/dL (ref 6–20)
CALCIUM: 9.2 mg/dL (ref 8.9–10.3)
CO2: 21 mmol/L — ABNORMAL LOW (ref 22–32)
Chloride: 105 mmol/L (ref 101–111)
Creatinine, Ser: 0.55 mg/dL (ref 0.44–1.00)
GFR calc non Af Amer: >60 mL/min (ref >60)
Glucose, Bld: 100 mg/dL — ABNORMAL HIGH (ref 65–99)
Potassium: 3.8 mmol/L (ref 3.5–5.1)
Sodium: 133 mmol/L — ABNORMAL LOW (ref 135–145)
TOTAL PROTEIN: 7.5 g/dL (ref 6.5–8.1)

## 2017-12-18 LAB — HCG, QUANTITATIVE, PREGNANCY: hCG, Beta Chain, Quant, S: 143923 m[IU]/mL — ABNORMAL HIGH (ref <5)

## 2017-12-18 LAB — CBC
HCT: 37.5 % (ref 35.0–47.0)
HEMOGLOBIN: 12.9 g/dL (ref 12.0–16.0)
MCH: 30.2 pg (ref 26.0–34.0)
MCHC: 34.4 g/dL (ref 32.0–36.0)
MCV: 87.5 fL (ref 80.0–100.0)
Platelets: 201 10*3/uL (ref 150–440)
RBC: 4.28 MIL/uL (ref 3.80–5.20)
RDW: 13.8 % (ref 11.5–14.5)
WBC: 10.3 10*3/uL (ref 3.6–11.0)

## 2017-12-18 LAB — LIPASE, BLOOD: Lipase: 29 U/L (ref 11–51)

## 2017-12-18 LAB — POCT PREGNANCY, URINE: Preg Test, Ur: POSITIVE — AB

## 2017-12-18 NOTE — Discharge Instructions (Signed)
Please seek medical attention for any high fevers, chest pain, shortness of breath, change in behavior, persistent vomiting, bloody stool or any other new or concerning symptoms.  

## 2017-12-18 NOTE — ED Provider Notes (Signed)
[facility] Emergency Department Provider Note   ____________________________________________   I have reviewed the triage vital signs and the nursing notes.   HISTORY  Chief Complaint Hematemesis   History limited by: Not Limited   HPI Jamie Gentry is a 22 y.o. female who presents to the emergency department today with primary complaint of seeing blood with her vomit.  She states that she has been vomiting for the past couple of weeks.  Just noticed the blood last night.  She describes a small streaks of red blood mixed in with the vomit.  She denies any upper abdominal pain.  She does state that she thinks she might of been pregnant although never took a pregnancy test.  Last menstrual cycle was roughly 2 months ago.   Per medical record review patient has a history of pregnancy in the past.  Past Medical History:  Diagnosis Date  . Trichomoniasis 07/15/2016    Patient Active Problem List   Diagnosis Date Noted  . Postpartum care following vaginal delivery 01/30/2017    History reviewed. No pertinent surgical history.  Prior to Admission medications   Medication Sig Start Date End Date Taking? Authorizing Provider  ibuprofen (ADVIL,MOTRIN) 600 MG tablet Take 1 tablet (600 mg total) by mouth every 6 (six) hours. 02/01/17   Conard NovakJackson, Stephen D, MD  Prenatal Vit-Fe Fumarate-FA (MULTIVITAMIN-PRENATAL) 27-0.8 MG TABS tablet Take 1 tablet by mouth daily at 12 noon.    [provider]    Allergies Patient has no known allergies.  History reviewed. No pertinent family history.  Social History Social History   Tobacco Use  . Smoking status: Current Some Day Smoker  . Smokeless tobacco: Never Used  Substance Use Topics  . Alcohol use: No  . Drug use: No    Review of Systems Constitutional: No fever/chills Eyes: No visual changes. ENT: No sore throat. Cardiovascular: Denies chest pain. Respiratory: Denies shortness of  breath. Gastrointestinal: No abdominal pain. Positive for nausea and vomiting.  Genitourinary: Negative for dysuria. Musculoskeletal: Negative for back pain. Skin: Negative for rash. Neurological: Negative for headaches, focal weakness or numbness.  ____________________________________________   PHYSICAL EXAM:  VITAL SIGNS: ED Triage Vitals  Enc Vitals Group     BP 12/18/17 1444 128/64     Pulse Rate 12/18/17 1444 94     Resp 12/18/17 1444 18     Temp 12/18/17 1444 98.7 F (37.1 C)     Temp Source 12/18/17 1444 Oral     SpO2 12/18/17 1444 100 %     Weight 12/18/17 1445 160 lb (72.6 kg)     Height 12/18/17 1445 5\' 4"  (1.626 m)     Head Circumference --      Peak Flow --      Pain Score 12/18/17 1445 7   Constitutional: Alert and oriented. Well appearing and in no distress. Eyes: Conjunctivae are normal.  ENT   Head: Normocephalic and atraumatic.   Nose: No congestion/rhinnorhea.   Mouth/Throat: Mucous membranes are moist.   Neck: No stridor. Hematological/Lymphatic/Immunilogical: No cervical lymphadenopathy. Cardiovascular: Normal rate, regular rhythm.  No murmurs, rubs, or gallops.  Respiratory: Normal respiratory effort without tachypnea nor retractions. Breath sounds are clear and equal bilaterally. No wheezes/rales/rhonchi. Gastrointestinal: Soft and non tender. No rebound. No guarding.  Genitourinary: Deferred Musculoskeletal: Normal range of motion in all extremities. No lower extremity edema. Neurologic:  Normal speech and language. No gross focal neurologic deficits are appreciated.  Skin:  Skin is warm, dry  and intact. No rash noted. Psychiatric: Mood and affect are normal. Speech and behavior are normal. Patient exhibits appropriate insight and judgment.  ____________________________________________    LABS (pertinent positives/negatives)  Upreg positive bhcg 143,923 UA not consistent with infection Lipase CMP na 133, glu 100, cr 0.55 CBC  wnl ____________________________________________   EKG  None  ____________________________________________    RADIOLOGY  None  ____________________________________________   PROCEDURES  Procedures  ____________________________________________   INITIAL IMPRESSION / ASSESSMENT AND PLAN / ED COURSE  Pertinent labs & imaging results that were available during my care of the patient were reviewed by me and considered in my medical decision making (see chart for details).  Patient here with concerns for some blood in the setting of vomiting for a couple of weeks.  At this point I think Mallory-Weiss tear is unlikely.  Does not sound like was a significant amount of blood.  Patient was found to be positive on her pregnancy test.  No abdominal tenderness.  Discussed with patient importance of following up with OB/GYN.    ____________________________________________   FINAL CLINICAL IMPRESSION(S) / ED DIAGNOSES  Final diagnoses:  Pregnancy, unspecified gestational age  Mallory-Weiss tear     Note: This dictation was prepared with Dragon dictation. Any transcriptional errors that result from this process are unintentional     Phineas Semen, MD 12/18/17 [phone]

## 2017-12-18 NOTE — ED Triage Notes (Signed)
Pt arrives to ED c/o of on and off again vomiting. States last night noticed some blood in vomit. States it was light. Denies bright red. States she has been sick about a week. States diarrhea. Denies fever. Denies cough and congestion. Alert, oriented, ambulatory. No distress noted.

## 2017-12-31 ENCOUNTER — Emergency Department
Admission: EM | Admit: 2017-12-31 | Discharge: 2017-12-31 | Disposition: A | Discharge status: Home / Self Care | Payer: Medicaid Other | Attending: Emergency Medicine | Admitting: Emergency Medicine

## 2017-12-31 ENCOUNTER — Other Ambulatory Visit: Payer: Self-pay

## 2017-12-31 ENCOUNTER — Encounter: Payer: Self-pay | Admitting: Emergency Medicine

## 2017-12-31 DIAGNOSIS — Z9189 Other specified personal risk factors, not elsewhere classified: Secondary | ICD-10-CM

## 2017-12-31 DIAGNOSIS — Z5321 Procedure and treatment not carried out due to patient leaving prior to being seen by health care provider: Secondary | ICD-10-CM | POA: Diagnosis not present

## 2017-12-31 DIAGNOSIS — O26899 Other specified pregnancy related conditions, unspecified trimester: Secondary | ICD-10-CM | POA: Insufficient documentation

## 2017-12-31 LAB — CBC
HEMATOCRIT: 36.4 % (ref 35.0–47.0)
HEMOGLOBIN: 12.7 g/dL (ref 12.0–16.0)
MCH: 30.2 pg (ref 26.0–34.0)
MCHC: 34.9 g/dL (ref 32.0–36.0)
MCV: 86.6 fL (ref 80.0–100.0)
Platelets: 254 10*3/uL (ref 150–440)
RBC: 4.21 MIL/uL (ref 3.80–5.20)
RDW: 13.3 % (ref 11.5–14.5)
WBC: 8.4 10*3/uL (ref 3.6–11.0)

## 2017-12-31 LAB — URINALYSIS, COMPLETE (UACMP) WITH MICROSCOPIC
BACTERIA UA: NONE SEEN
BILIRUBIN URINE: NEGATIVE
Glucose, UA: NEGATIVE mg/dL
HGB URINE DIPSTICK: NEGATIVE
Ketones, ur: 20 mg/dL — AB
LEUKOCYTES UA: NEGATIVE
NITRITE: NEGATIVE
PROTEIN: 30 mg/dL — AB
Specific Gravity, Urine: 1.028 (ref 1.005–1.030)
pH: 6 (ref 5.0–8.0)

## 2017-12-31 LAB — COMPREHENSIVE METABOLIC PANEL
ALBUMIN: 3.9 g/dL (ref 3.5–5.0)
ALT: 19 U/L (ref 14–54)
ANION GAP: 7 (ref 5–15)
AST: 19 U/L (ref 15–41)
Alkaline Phosphatase: 62 U/L (ref 38–126)
BILIRUBIN TOTAL: 0.6 mg/dL (ref 0.3–1.2)
BUN: 8 mg/dL (ref 6–20)
CO2: 23 mmol/L (ref 22–32)
Calcium: 9.6 mg/dL (ref 8.9–10.3)
Chloride: 105 mmol/L (ref 101–111)
Creatinine, Ser: 0.61 mg/dL (ref 0.44–1.00)
GFR calc Af Amer: >60 mL/min (ref >60)
Glucose, Bld: 107 mg/dL — ABNORMAL HIGH (ref 65–99)
POTASSIUM: 3.6 mmol/L (ref 3.5–5.1)
Sodium: 135 mmol/L (ref 135–145)
TOTAL PROTEIN: 7.8 g/dL (ref 6.5–8.1)

## 2017-12-31 LAB — HCG, QUANTITATIVE, PREGNANCY: HCG, BETA CHAIN, QUANT, S: 267526 m[IU]/mL — AB (ref <5)

## 2017-12-31 LAB — LIPASE, BLOOD: Lipase: 38 U/L (ref 11–51)

## 2017-12-31 NOTE — ED Notes (Signed)
Called x 3 for room. Not in waiting room.

## 2017-12-31 NOTE — ED Triage Notes (Addendum)
Pt arrived via POV with c/o not feeling good while pregnant. Pt c/o nausea and vomiting, also c/o headache. Pt does not know how far along she is and has not followed up with OB-GYN.  Pt states she took tylenol for the pain with no relief.

## 2018-01-13 NOTE — ED Provider Notes (Signed)
-----------------------------------------   11:06 AM on 01/13/2018 -----------------------------------------  Late entry  I did not see this pt, she eloped from the waiting room.    Jeanmarie PlantMcShane, James A, MD 01/13/18 1106

## 2018-02-09 ENCOUNTER — Other Ambulatory Visit: Payer: Self-pay | Admitting: Family Medicine

## 2018-02-09 DIAGNOSIS — Z3480 Encounter for supervision of other normal pregnancy, unspecified trimester: Secondary | ICD-10-CM

## 2018-02-09 DIAGNOSIS — Z3490 Encounter for supervision of normal pregnancy, unspecified, unspecified trimester: Principal | ICD-10-CM

## 2018-02-14 ENCOUNTER — Emergency Department
Admission: EM | Admit: 2018-02-14 | Discharge: 2018-02-14 | Disposition: A | Discharge status: Home / Self Care | Payer: Medicaid Other | Attending: Emergency Medicine | Admitting: Emergency Medicine

## 2018-02-14 ENCOUNTER — Other Ambulatory Visit: Payer: Self-pay

## 2018-02-14 ENCOUNTER — Encounter: Payer: Self-pay | Admitting: Emergency Medicine

## 2018-02-14 DIAGNOSIS — M549 Dorsalgia, unspecified: Secondary | ICD-10-CM | POA: Insufficient documentation

## 2018-02-14 DIAGNOSIS — Z5321 Procedure and treatment not carried out due to patient leaving prior to being seen by health care provider: Secondary | ICD-10-CM | POA: Insufficient documentation

## 2018-02-14 LAB — URINALYSIS, COMPLETE (UACMP) WITH MICROSCOPIC
Bilirubin Urine: NEGATIVE
GLUCOSE, UA: NEGATIVE mg/dL
Hgb urine dipstick: NEGATIVE
Ketones, ur: NEGATIVE mg/dL
Leukocytes, UA: NEGATIVE
Nitrite: NEGATIVE
PH: 6 (ref 5.0–8.0)
Protein, ur: NEGATIVE mg/dL
Specific Gravity, Urine: 1.026 (ref 1.005–1.030)

## 2018-02-14 NOTE — ED Triage Notes (Signed)
Pt reports that she woke up this am with blood in her urine and pain in her back. Denies any N/V.

## 2018-02-14 NOTE — ED Notes (Signed)
Pt called without response x 1

## 2018-02-15 ENCOUNTER — Telehealth: Payer: Self-pay | Admitting: Emergency Medicine

## 2018-02-15 NOTE — Telephone Encounter (Signed)
Called patient due to lwot to inquire about condition and follow up plans. Says she is going to her doctor now.

## 2018-02-28 ENCOUNTER — Ambulatory Visit: Payer: Medicaid Other

## 2018-03-03 ENCOUNTER — Ambulatory Visit: Payer: Medicaid Other

## 2018-03-03 ENCOUNTER — Ambulatory Visit
Admission: RE | Admit: 2018-03-03 | Discharge: 2018-03-03 | Disposition: A | Discharge status: Home / Self Care | Payer: Medicaid Other | Source: Ambulatory Visit | Attending: Family Medicine | Admitting: Family Medicine

## 2018-03-03 DIAGNOSIS — Z3480 Encounter for supervision of other normal pregnancy, unspecified trimester: Secondary | ICD-10-CM | POA: Diagnosis present

## 2018-03-03 DIAGNOSIS — Z3A19 19 weeks gestation of pregnancy: Secondary | ICD-10-CM | POA: Diagnosis not present

## 2018-03-03 DIAGNOSIS — O321XX0 Maternal care for breech presentation, not applicable or unspecified: Secondary | ICD-10-CM | POA: Insufficient documentation

## 2018-03-03 DIAGNOSIS — Z3490 Encounter for supervision of normal pregnancy, unspecified, unspecified trimester: Secondary | ICD-10-CM

## 2018-04-20 ENCOUNTER — Emergency Department
Admission: EM | Admit: 2018-04-20 | Discharge: 2018-04-20 | Disposition: A | Discharge status: Home / Self Care | Payer: Medicaid Other | Attending: Emergency Medicine | Admitting: Emergency Medicine

## 2018-04-20 ENCOUNTER — Encounter: Payer: Self-pay | Admitting: Emergency Medicine

## 2018-04-20 ENCOUNTER — Other Ambulatory Visit: Payer: Self-pay

## 2018-04-20 DIAGNOSIS — Z5321 Procedure and treatment not carried out due to patient leaving prior to being seen by health care provider: Secondary | ICD-10-CM | POA: Insufficient documentation

## 2018-04-20 DIAGNOSIS — R109 Unspecified abdominal pain: Secondary | ICD-10-CM | POA: Diagnosis present

## 2018-04-20 LAB — BASIC METABOLIC PANEL
Anion gap: 7 (ref 5–15)
BUN: 8 mg/dL (ref 6–20)
CHLORIDE: 108 mmol/L (ref 98–111)
CO2: 21 mmol/L — AB (ref 22–32)
Calcium: 8.6 mg/dL — ABNORMAL LOW (ref 8.9–10.3)
Creatinine, Ser: 0.35 mg/dL — ABNORMAL LOW (ref 0.44–1.00)
GFR calc Af Amer: >60 mL/min (ref >60)
GFR calc non Af Amer: >60 mL/min (ref >60)
Glucose, Bld: 89 mg/dL (ref 70–99)
POTASSIUM: 3.7 mmol/L (ref 3.5–5.1)
Sodium: 136 mmol/L (ref 135–145)

## 2018-04-20 LAB — CBC
HEMATOCRIT: 31.2 % — AB (ref 35.0–47.0)
Hemoglobin: 11.2 g/dL — ABNORMAL LOW (ref 12.0–16.0)
MCH: 31.9 pg (ref 26.0–34.0)
MCHC: 35.8 g/dL (ref 32.0–36.0)
MCV: 89 fL (ref 80.0–100.0)
Platelets: 166 10*3/uL (ref 150–440)
RBC: 3.51 MIL/uL — ABNORMAL LOW (ref 3.80–5.20)
RDW: 12.7 % (ref 11.5–14.5)
WBC: 6.9 10*3/uL (ref 3.6–11.0)

## 2018-04-20 LAB — HEPATIC FUNCTION PANEL
ALBUMIN: 3 g/dL — AB (ref 3.5–5.0)
ALK PHOS: 49 U/L (ref 38–126)
ALT: 10 U/L (ref 0–44)
AST: 13 U/L — AB (ref 15–41)
Bilirubin, Direct: <0.1 mg/dL (ref 0.0–0.2)
Total Bilirubin: 0.5 mg/dL (ref 0.3–1.2)
Total Protein: 6.4 g/dL — ABNORMAL LOW (ref 6.5–8.1)

## 2018-04-20 LAB — TROPONIN I: Troponin I: <0.03 ng/mL (ref <0.03)

## 2018-04-20 LAB — LIPASE, BLOOD: Lipase: 23 U/L (ref 11–51)

## 2018-04-20 NOTE — ED Provider Notes (Signed)
-----------------------------------------   1:39 PM on 04/20/2018 -----------------------------------------  Patient's vital signs were stable and lab work is reassuring with no evidence of acute abnormality.  I added on hepatic function panel and lipase given the possibility her symptoms, based on the triage note, could be the result of gallbladder disease.  Unfortunately when I went to see the patient she had left without being seen.  I never saw the patient or had any direct interaction with her or participated in her care other than adding on the lipase and hepatic function panel.   Loleta RoseForbach, Reganne Messerschmidt, MD 04/20/18 1340

## 2018-04-20 NOTE — ED Notes (Signed)
FHT 144

## 2018-04-20 NOTE — ED Notes (Signed)
Pt no longer in room when MD went to see Pt. RN rechecked room to make sure pt had not stepped out to use restroom. Pt still no longer in room.

## 2018-04-20 NOTE — ED Triage Notes (Signed)
Says started yesterday with painupper mid chest--felt like it was caving in.  Goes into back.  Today after arriving here by ems she says she noticed pain in left lower quadrant. No ruinary symptoms.

## 2018-06-03 ENCOUNTER — Other Ambulatory Visit: Payer: Self-pay

## 2018-06-03 ENCOUNTER — Observation Stay
Admission: EM | Admit: 2018-06-03 | Discharge: 2018-06-03 | Disposition: A | Discharge status: Home / Self Care | Payer: Medicaid Other | Attending: Obstetrics and Gynecology | Admitting: Obstetrics and Gynecology

## 2018-06-03 DIAGNOSIS — Z87891 Personal history of nicotine dependence: Secondary | ICD-10-CM | POA: Diagnosis not present

## 2018-06-03 DIAGNOSIS — O26893 Other specified pregnancy related conditions, third trimester: Secondary | ICD-10-CM | POA: Diagnosis present

## 2018-06-03 DIAGNOSIS — O23593 Infection of other part of genital tract in pregnancy, third trimester: Principal | ICD-10-CM | POA: Insufficient documentation

## 2018-06-03 DIAGNOSIS — Z3A32 32 weeks gestation of pregnancy: Secondary | ICD-10-CM | POA: Insufficient documentation

## 2018-06-03 DIAGNOSIS — N898 Other specified noninflammatory disorders of vagina: Secondary | ICD-10-CM | POA: Diagnosis present

## 2018-06-03 DIAGNOSIS — N760 Acute vaginitis: Secondary | ICD-10-CM | POA: Diagnosis present

## 2018-06-03 LAB — WET PREP, GENITAL
SPERM: NONE SEEN
Trich, Wet Prep: NONE SEEN
Yeast Wet Prep HPF POC: NONE SEEN

## 2018-06-03 LAB — URINALYSIS, ROUTINE W REFLEX MICROSCOPIC
Bilirubin Urine: NEGATIVE
GLUCOSE, UA: NEGATIVE mg/dL
HGB URINE DIPSTICK: NEGATIVE
Ketones, ur: NEGATIVE mg/dL
LEUKOCYTES UA: NEGATIVE
Nitrite: NEGATIVE
PH: 7 (ref 5.0–8.0)
Protein, ur: NEGATIVE mg/dL
SPECIFIC GRAVITY, URINE: 1.013 (ref 1.005–1.030)

## 2018-06-03 LAB — CHLAMYDIA/NGC RT PCR (ARMC ONLY)
Chlamydia Tr: NOT DETECTED
N GONORRHOEAE: NOT DETECTED

## 2018-06-03 MED ORDER — METRONIDAZOLE 500 MG PO TABS
500.0000 mg | ORAL_TABLET | Freq: Two times a day (BID) | ORAL | Status: DC
  Administered 2018-06-03: 500 mg via ORAL
  Filled 2018-06-03: qty 1

## 2018-06-03 MED ORDER — METRONIDAZOLE 500 MG PO TABS: 500.0000 mg | ORAL_TABLET | Freq: Two times a day (BID) | ORAL | 0 refills | Status: DC

## 2018-06-03 NOTE — OB Triage Note (Signed)
Pt arrived to unit with complaints of increased vaginal discharge that is clear and thin and vaginal/pelvic soreness. No bleeding or spotting. No complaints of contraction. +FM.

## 2018-06-03 NOTE — Discharge Summary (Signed)
Jamie Gentry is a 22 y.o. female. She is at 5411w2d gestation. No LMP recorded (lmp unknown). Patient is pregnant. Estimated Date of Delivery: 07/27/18  [facility]t about 1 month ago.   Current pregnancy complicated by:  1. Closely spaced pregnancies- last delivery 1 yr ago.  2. No prenatal recs available, anatomy US available, reviewed images, cervical length on images 4.8cm measured (documented in results as 0.9cm).   Chief complaint: leaking fluid since yesterday, vaginal/pelvic soreness. reports last IC about 1 month ago. Denies problems this pregnancy.   S: Resting comfortably. no CTX, no VB.no LOF,  Active fetal movement. Denies: HA, visual changes, SOB, or RUQ/epigastric pain  Maternal Medical History:   Past Medical History:  Diagnosis Date  . Trichomoniasis 07/15/2016    No past surgical history on file.   OB History    Gravida  3   Para  1   Term  1   Preterm  0   AB  0   Living  1     SAB  0   TAB  0   Ectopic  0   Multiple  0   Live Births  1         -reports no previous pregnancy complications, both term vaginal births.   No Known Allergies  Prior to Admission medications   Medication Sig Start Date End Date Taking? Authorizing Provider  Prenatal Vit-Fe Fumarate-FA (MULTIVITAMIN-PRENATAL) 27-0.8 MG TABS tablet Take 1 tablet by mouth daily at 12 noon.   Yes [provider]  ibuprofen (ADVIL,MOTRIN) 600 MG tablet Take 1 tablet (600 mg total) by mouth every 6 (six) hours. Patient not taking: Reported on 06/03/2018 02/01/17   Conard NovakJackson, Stephen D, MD      Social History: She  reports that she has quit smoking. She has never used smokeless tobacco. She reports that she does not drink alcohol or use drugs.  Family History: no history of gyn cancers  Review of Systems: A full review of systems was performed and negative except as noted in the HPI.     O:  BP 113/61   Pulse (!) 105   Temp  98.4 F (36.9 C) (Oral)   Resp 18   Ht 5\' 4"  (1.626 m)   Wt 86.2 kg   LMP  (LMP Unknown)   BMI 32.61 kg/m  Results for orders placed or performed during the hospital encounter of 06/03/18 (from the past 48 hour(s))  Wet prep, genital   Collection Time: 06/03/18  8:37 PM  Result Value Ref Range   Yeast Wet Prep HPF POC NONE SEEN NONE SEEN   Trich, Wet Prep NONE SEEN NONE SEEN   Clue Cells Wet Prep HPF POC PRESENT (A) NONE SEEN   WBC, Wet Prep HPF POC MANY (A) NONE SEEN   Sperm NONE SEEN   Chlamydia/NGC rt PCR ([facility] only)   Collection Time: 06/03/18  8:37 PM  Result Value Ref Range   Specimen source GC/Chlam ENDOCERVICAL    Chlamydia Tr NOT DETECTED NOT DETECTED   N gonorrhoeae NOT DETECTED NOT DETECTED  Urinalysis, Routine w reflex microscopic   Collection Time: 06/03/18  8:43 PM  Result Value Ref Range   Color, Urine YELLOW (A) YELLOW   APPearance CLEAR (A) CLEAR   Specific Gravity, Urine 1.013 1.005 - 1.030   pH 7.0 5.0 - 8.0   Glucose, UA NEGATIVE NEGATIVE mg/dL   Hgb urine dipstick NEGATIVE NEGATIVE  Bilirubin Urine NEGATIVE NEGATIVE   Ketones, ur NEGATIVE NEGATIVE mg/dL   Protein, ur NEGATIVE NEGATIVE mg/dL   Nitrite NEGATIVE NEGATIVE   Leukocytes, UA NEGATIVE NEGATIVE     Constitutional: NAD, AAOx3  HE/ENT: extraocular movements grossly intact, moist mucous membranes CV: RRR PULM: nl respiratory effort, CTABL     Abd: gravid, non-tender, non-distended, soft Ext: Non-tender, Nonedematous   Psych: mood appropriate, speech normal  Pelvic: SSE done: erythematous vaginal tissue, small amt adherent vaginal discharge, no pooling, + odor noted.   SVE: Dilation: Closed Effacement (%): Thick Exam by:: R. Jesten Cappuccio, CNM   Fetal  monitoring: Cat I Appropriate for GA Baseline: 135bpm Variability: moderate Accelerations: 10*10 accels noted.  Decelerations absent  TOCO: mild uterine irritability  Wet prep: + clue cells, + WBCs GC/CT neg   A/P: 22 y.o. [redacted]w[redacted]d here  for antenatal surveillance for acute vaginitis  No e/o PTL or ROM.   Bacterial vaginosis, Rx Flagyl 500mg  PO BID, 1st dose given tonight, pt to p/u Rx at pharmacy tomorrow.   GC/CT: neg  Fetal Wellbeing: Reassuring Cat 1 tracing   D/c home stable, precautions reviewed, follow-up as scheduled at [facility] next week.    Jayland Null A, CNM 06/03/2018  10:35 PM

## 2018-06-06 LAB — OB RESULTS CONSOLE RUBELLA ANTIBODY, IGM: Rubella: IMMUNE

## 2018-06-06 LAB — OB RESULTS CONSOLE GC/CHLAMYDIA
Chlamydia: NEGATIVE
Gonorrhea: NEGATIVE

## 2018-06-06 LAB — OB RESULTS CONSOLE HEPATITIS B SURFACE ANTIGEN: Hepatitis B Surface Ag: NEGATIVE

## 2018-06-06 LAB — HIV ANTIBODY (ROUTINE TESTING W REFLEX): HIV SCREEN 4TH GENERATION: NONREACTIVE

## 2018-06-06 LAB — OB RESULTS CONSOLE RPR: RPR: NONREACTIVE

## 2018-06-13 ENCOUNTER — Encounter: Payer: Self-pay | Admitting: Emergency Medicine

## 2018-06-13 ENCOUNTER — Emergency Department
Admission: EM | Admit: 2018-06-13 | Discharge: 2018-06-13 | Disposition: A | Discharge status: Home / Self Care | Payer: Medicaid Other | Attending: Emergency Medicine | Admitting: Emergency Medicine

## 2018-06-13 DIAGNOSIS — O9A213 Injury, poisoning and certain other consequences of external causes complicating pregnancy, third trimester: Secondary | ICD-10-CM | POA: Insufficient documentation

## 2018-06-13 DIAGNOSIS — Z87891 Personal history of nicotine dependence: Secondary | ICD-10-CM | POA: Diagnosis not present

## 2018-06-13 DIAGNOSIS — T63441A Toxic effect of venom of bees, accidental (unintentional), initial encounter: Secondary | ICD-10-CM | POA: Diagnosis not present

## 2018-06-13 DIAGNOSIS — Z3A34 34 weeks gestation of pregnancy: Secondary | ICD-10-CM | POA: Insufficient documentation

## 2018-06-13 NOTE — ED Provider Notes (Signed)
[facility] Emergency Department Provider Note ____________________________________________  Time seen: 1218  I have reviewed the triage vital signs and the nursing notes.  HISTORY  Chief Complaint  Insect Bite  HPI Jamie Gentry is a 22 y.o. female presents herself to the ED for evaluation of bee sting to the left upper back.  Patient reports being outside 2 hours prior to arrival, when she sustained a single bee sting to the upper back.  She denies any anaphylaxis or history of the same.  She denies any swelling, diaphoresis, nausea, vomiting, chest pain, or shortness of breath patient denies any swelling to the mouth, lips, throat, or tongue.  She presents herself today, with concern that she may be allergic to stings, but she denies any worsening of her symptoms.  She reports local tenderness and itching to the bee sting.  Past Medical History:  Diagnosis Date  . Trichomoniasis 07/15/2016    Patient Active Problem List   Diagnosis Date Noted  . Vaginal discharge during pregnancy in third trimester 06/03/2018  . Postpartum care following vaginal delivery 01/30/2017    History reviewed. No pertinent surgical history.  Prior to Admission medications   Medication Sig Start Date End Date Taking? Authorizing Provider  metroNIDAZOLE (FLAGYL) 500 MG tablet Take 1 tablet (500 mg total) by mouth every 12 (twelve) hours. 06/03/18   McVey, Prudencio Pair, CNM  Prenatal Vit-Fe Fumarate-FA (MULTIVITAMIN-PRENATAL) 27-0.8 MG TABS tablet Take 1 tablet by mouth daily at 12 noon.    [provider]    Allergies Patient has no known allergies.  No family history on file.  Social History Social History   Tobacco Use  . Smoking status: Former Games developer  . Smokeless tobacco: Never Used  Substance Use Topics  . Alcohol use: No  . Drug use: No    Review of Systems  Constitutional: Negative for fever. Denies diaphoresis Eyes: Negative for visual changes. ENT:  Negative for sore throat. Cardiovascular: Negative for chest pain. Respiratory: Negative for shortness of breath. Gastrointestinal: Negative for abdominal pain, vomiting and diarrhea. Genitourinary: Negative for dysuria. Musculoskeletal: Negative for back pain. Skin: Negative for rash. Bee sting as above Neurological: Negative for headaches, focal weakness or numbness. ____________________________________________  PHYSICAL EXAM:   VITAL SIGNS: ED Triage Vitals  Enc Vitals Group     BP 06/13/18 1140 110/60     Pulse Rate 06/13/18 1140 (!) 107     Resp 06/13/18 1140 18     Temp 06/13/18 1140 98.6 F (37 C)     Temp Source 06/13/18 1140 Oral     SpO2 06/13/18 1140 100 %     Weight 06/13/18 1141 193 lb (87.5 kg)     Height 06/13/18 1141 5\' 4"  (1.626 m)     Head Circumference --      Peak Flow --      Pain Score 06/13/18 1141 10     Pain Loc --      Pain Edu? --      Excl. in GC? --     Constitutional: Alert and oriented. Well appearing and in no distress. Head: Normocephalic and atraumatic. Eyes: Conjunctivae are normal. PERRL. Normal extraocular movements Ears: Canals clear. TMs intact bilaterally. Nose: No congestion/rhinorrhea/epistaxis. Mouth/Throat: Mucous membranes are moist.  Uvula is midline and tonsils are flat. Cardiovascular: Normal rate, regular rhythm. Normal distal pulses. Respiratory: Normal respiratory effort. No wheezes/rales/rhonchi. Gastrointestinal: Soft and nontender. Gravid.  Musculoskeletal: Nontender with normal range of motion in all extremities.  Neurologic:  Normal gait without ataxia. Normal speech and language. No gross focal neurologic deficits are appreciated. Skin:  Skin is warm, dry and intact. No rash noted.  Single erythematous papule is noted to the left scapular thoracic region.  No significant surrounding erythema or   ____________________________________________  PROCEDURES  Procedures ____________________________________________  INITIAL IMPRESSION / ASSESSMENT AND PLAN / ED COURSE  Gravid female patient at [redacted] weeks gestation presents with a single bee sting to the left upper back.  Patient is without any signs of anaphylaxis.  Patient with a mild local reaction to a single bee sting.  She is reassured by her overall symptoms and is advised that she does not appear to have any serious allergic reaction.  Her symptoms likely represent a mild local reaction and should resolve the difficulty.  She is advised she may take Benadryl safely while pregnant for itch relief.  She also apply over-the-counter topical hydrocortisone cream for relief.  She should follow-up with OB provider for ongoing symptoms. ____________________________________________  FINAL CLINICAL IMPRESSION(S) / ED DIAGNOSES  Final diagnoses:  Bee sting reaction, accidental or unintentional, initial encounter      Lissa Hoard, PA-C 06/13/18 1353    Jene Every, MD 06/13/18 1452

## 2018-06-13 NOTE — ED Triage Notes (Addendum)
Patient was stung by an insect to her left upper back.  Patient states she has never been stung before so she was unsure of whether or not she was allergic.  Denies any shortness of breath or throat swelling.  Voice is clear and no obvious facial or tongue swelling noted.  Patient is in no obvious distress at this time.  Patient is approx. [redacted] weeks pregnant.

## 2018-06-13 NOTE — ED Notes (Signed)
See triage note  Presents with possible bee sting to left upper back this am  No resp distress noted

## 2018-06-13 NOTE — Discharge Instructions (Addendum)
You have a local reaction to a bee sting, this is common, and does not mean you have a bee sting allergy. Keep the area clean and apply ice compresses or OTC hydrocortisone cream. You may safely take OTC Benadryl or Claritin, or Zyrtec, for itch relief. Follow-up with your provider for ongoing symptoms.

## 2018-07-24 ENCOUNTER — Other Ambulatory Visit: Payer: Self-pay

## 2018-07-24 ENCOUNTER — Encounter: Payer: Self-pay | Admitting: Emergency Medicine

## 2018-07-24 ENCOUNTER — Inpatient Hospital Stay
Admission: EM | Admit: 2018-07-24 | Discharge: 2018-07-26 | DRG: 806 | Disposition: A | Discharge status: Home / Self Care | Payer: Medicaid Other | Attending: Obstetrics & Gynecology | Admitting: Obstetrics & Gynecology

## 2018-07-24 DIAGNOSIS — D62 Acute posthemorrhagic anemia: Secondary | ICD-10-CM | POA: Diagnosis not present

## 2018-07-24 DIAGNOSIS — J069 Acute upper respiratory infection, unspecified: Secondary | ICD-10-CM

## 2018-07-24 DIAGNOSIS — R109 Unspecified abdominal pain: Secondary | ICD-10-CM

## 2018-07-24 DIAGNOSIS — Z3A39 39 weeks gestation of pregnancy: Secondary | ICD-10-CM | POA: Diagnosis not present

## 2018-07-24 DIAGNOSIS — Z87891 Personal history of nicotine dependence: Secondary | ICD-10-CM | POA: Diagnosis not present

## 2018-07-24 DIAGNOSIS — O9081 Anemia of the puerperium: Principal | ICD-10-CM | POA: Diagnosis not present

## 2018-07-24 DIAGNOSIS — J019 Acute sinusitis, unspecified: Secondary | ICD-10-CM

## 2018-07-24 DIAGNOSIS — O26893 Other specified pregnancy related conditions, third trimester: Secondary | ICD-10-CM

## 2018-07-24 DIAGNOSIS — Z3483 Encounter for supervision of other normal pregnancy, third trimester: Secondary | ICD-10-CM | POA: Diagnosis present

## 2018-07-24 LAB — CBC
HCT: 30.2 % — ABNORMAL LOW (ref 36.0–46.0)
Hemoglobin: 10 g/dL — ABNORMAL LOW (ref 12.0–15.0)
MCH: 28.6 pg (ref 26.0–34.0)
MCHC: 33.1 g/dL (ref 30.0–36.0)
MCV: 86.3 fL (ref 80.0–100.0)
NRBC: 0 % (ref 0.0–0.2)
Platelets: 145 10*3/uL — ABNORMAL LOW (ref 150–400)
RBC: 3.5 MIL/uL — AB (ref 3.87–5.11)
RDW: 13.7 % (ref 11.5–15.5)
WBC: 5 10*3/uL (ref 4.0–10.5)

## 2018-07-24 LAB — GROUP B STREP BY PCR: GROUP B STREP BY PCR: POSITIVE — AB

## 2018-07-24 LAB — TYPE AND SCREEN
ABO/RH(D): O POS
Antibody Screen: NEGATIVE

## 2018-07-24 LAB — CHLAMYDIA/NGC RT PCR (ARMC ONLY)
Chlamydia Tr: NOT DETECTED
N GONORRHOEAE: NOT DETECTED

## 2018-07-24 MED ORDER — DM-GUAIFENESIN ER 30-600 MG PO TB12: 1.0000 | ORAL_TABLET | Freq: Two times a day (BID) | ORAL | Status: DC | PRN

## 2018-07-24 MED ORDER — GUAIFENESIN ER 600 MG PO TB12
600.0000 mg | ORAL_TABLET | Freq: Two times a day (BID) | ORAL | Status: DC | PRN
  Administered 2018-07-24 – 2018-07-26 (×4): 600 mg via ORAL
  Filled 2018-07-24 (×5): qty 1

## 2018-07-24 MED ORDER — OXYTOCIN 40 UNITS IN LACTATED RINGERS INFUSION - SIMPLE MED
2.5000 [IU]/h | INTRAVENOUS | Status: DC
  Administered 2018-07-24: 2.5 [IU]/h via INTRAVENOUS

## 2018-07-24 MED ORDER — LACTATED RINGERS IV SOLN
INTRAVENOUS | Status: DC
  Administered 2018-07-24: 13:00:00 via INTRAVENOUS

## 2018-07-24 MED ORDER — OXYTOCIN BOLUS FROM INFUSION
500.0000 mL | Freq: Once | INTRAVENOUS | Status: AC
  Administered 2018-07-24: 500 mL via INTRAVENOUS

## 2018-07-24 MED ORDER — ACETAMINOPHEN 500 MG PO TABS: 1000.0000 mg | ORAL_TABLET | Freq: Four times a day (QID) | ORAL | Status: DC | PRN

## 2018-07-24 MED ORDER — LIDOCAINE HCL (PF) 1 % IJ SOLN: 30.0000 mL | INTRAMUSCULAR | Status: DC | PRN

## 2018-07-24 MED ORDER — SODIUM CHLORIDE 0.9 % IV SOLN
5.0000 10*6.[IU] | Freq: Once | INTRAVENOUS | Status: AC
  Administered 2018-07-24: 5 10*6.[IU] via INTRAVENOUS
  Filled 2018-07-24: qty 5

## 2018-07-24 MED ORDER — MISOPROSTOL 200 MCG PO TABS
ORAL_TABLET | ORAL | Status: AC
  Filled 2018-07-24: qty 4

## 2018-07-24 MED ORDER — TERBUTALINE SULFATE 1 MG/ML IJ SOLN: 0.2500 mg | Freq: Once | INTRAMUSCULAR | Status: DC | PRN

## 2018-07-24 MED ORDER — OXYTOCIN BOLUS FROM INFUSION: 500.0000 mL | Freq: Once | INTRAVENOUS | Status: DC

## 2018-07-24 MED ORDER — AMMONIA AROMATIC IN INHA
RESPIRATORY_TRACT | Status: AC
  Filled 2018-07-24: qty 10

## 2018-07-24 MED ORDER — ONDANSETRON HCL 4 MG/2ML IJ SOLN: 4.0000 mg | Freq: Four times a day (QID) | INTRAMUSCULAR | Status: DC | PRN

## 2018-07-24 MED ORDER — LIDOCAINE HCL (PF) 1 % IJ SOLN
INTRAMUSCULAR | Status: AC
  Filled 2018-07-24: qty 30

## 2018-07-24 MED ORDER — LACTATED RINGERS IV SOLN: 500.0000 mL | INTRAVENOUS | Status: DC | PRN

## 2018-07-24 MED ORDER — PENICILLIN G 3 MILLION UNITS IVPB - SIMPLE MED
3.0000 10*6.[IU] | INTRAVENOUS | Status: DC
  Administered 2018-07-24: 3 10*6.[IU] via INTRAVENOUS
  Filled 2018-07-24: qty 3
  Filled 2018-07-24 (×4): qty 100

## 2018-07-24 MED ORDER — OXYTOCIN 10 UNIT/ML IJ SOLN
INTRAMUSCULAR | Status: AC
  Filled 2018-07-24: qty 2

## 2018-07-24 MED ORDER — DEXTROMETHORPHAN POLISTIREX ER 30 MG/5ML PO SUER
30.0000 mg | Freq: Two times a day (BID) | ORAL | Status: DC | PRN
  Administered 2018-07-24 – 2018-07-26 (×4): 30 mg via ORAL
  Filled 2018-07-24 (×5): qty 5

## 2018-07-24 MED ORDER — SOD CITRATE-CITRIC ACID 500-334 MG/5ML PO SOLN: 30.0000 mL | ORAL | Status: DC | PRN

## 2018-07-24 MED ORDER — OXYTOCIN 40 UNITS IN LACTATED RINGERS INFUSION - SIMPLE MED
1.0000 m[IU]/min | INTRAVENOUS | Status: DC
  Administered 2018-07-24: 4 m[IU]/min via INTRAVENOUS
  Filled 2018-07-24: qty 1000

## 2018-07-24 MED ORDER — BUTORPHANOL TARTRATE 1 MG/ML IJ SOLN
1.0000 mg | INTRAMUSCULAR | Status: DC | PRN
  Filled 2018-07-24: qty 1

## 2018-07-24 NOTE — Discharge Summary (Signed)
Obstetrical Discharge Summary  Patient Name: Jamie Gentry DOB: 1995/12/07 MRN: [id number]  Date of Admission: 07/24/2018 Date of Delivery: 07/24/18 Delivered by: Ranae Plumber, MD Date of Discharge: 07/26/2018  [facility]  [id number] last menstrual period was 10/25/2017 (exact date). EDC Estimated Date of Delivery: 08/01/18 Gestational Age at Delivery: [redacted]w[redacted]d   Antepartum complications:   1. Short interpregnancy interval - last delivery <89mos ago 2. Anemia - Hb 8.9 prior to admission 3. Varicella non-immune 4. Insufficient prenatal care - 4 prenatal visits 5. 2x uncomplicated SVD   Admitting Diagnosis: labor  Secondary Diagnosis: Patient Active Problem List   Diagnosis Date Noted  . Labor and delivery indication for care or intervention 07/24/2018  . Vaginal discharge during pregnancy in third trimester 06/03/2018  . Postpartum care following vaginal delivery 01/30/2017    Augmentation: AROM and Pitocin Complications: None Intrapartum complications/course: Mom presented to L&D with labor, augmented with pitocin. GBS prophylaxis given with penicillin. AROM'd bulging bag at 8cm for clear fluid. Progressed to complete, second stage: one push.  delivery of fetal head with restitution to ROT.   Anterior then posterior shoulders delivered without difficulty.  Baby placed on mom's chest, and attended to by peds.    Cord was then clamped and cut when pulseless.  Placenta spontaneously delivered, intact.   IV pitocin given for hemorrhage prophylaxis. Date of Delivery: 07/24/18 Delivered By: Leeroy Bock Nafisah Runions Delivery Type: spontaneous vaginal delivery Anesthesia: none Placenta: sponatneous Laceration: none Episiotomy: none Newborn Data: Live born female "Jamie Gentry" Birth Weight:  3550g, 7lb 13oz APGAR: 8, 9  Newborn Delivery   Birth date/time:  07/24/2018 21:26:00 Delivery type:  Vaginal, Spontaneous     Postpartum Procedures: none  Post partum course:   Patient had an uncomplicated postpartum course.  By time of discharge on PPD#2, her pain was controlled on oral pain medications; she had appropriate lochia and was ambulating, voiding without difficulty and tolerating regular diet.  She was deemed stable for discharge to home.       Discharge Physical Exam:  BP 111/74 (BP Location: Left Arm)   Pulse 74   Temp 98.1 F (36.7 C) (Oral)   Resp 20   Ht 5\' 3"  (1.6 m)   Wt 93 kg   LMP 10/25/2017 (Exact Date)   SpO2 100%   Breastfeeding? Unknown   BMI 36.31 kg/m   General: NAD CV: RRR Pulm: CTABL, nl effort ABD: s/nd/nt, fundus firm and below the umbilicus Lochia: moderate DVT Evaluation: LE non-ttp, no evidence of DVT on exam.  Hemoglobin  Date Value Ref Range Status  07/25/2018 9.7 (L) 12.0 - 15.0 g/dL Final   HGB  Date Value Ref Range Status  02/25/2013 10.2 (L) 12.0 - 16.0 g/dL Final   HCT  Date Value Ref Range Status  07/25/2018 29.5 (L) 36.0 - 46.0 % Final  02/28/2013 30.9 (L) 35.0 - 47.0 % Final     Disposition: stable, discharge to home. Baby Feeding: formula Baby Disposition: home with mom  Rh Immune globulin given: n/a Rubella vaccine given: n/a Varicella vaccine given: offered Tdap vaccine given in AP or PP setting: declined Flu vaccine given in AP or PP setting: offered at discharge    Contraception: offered depo-provera, declined - discussed IUD, and pills.  Prenatal Labs:  Blood type/Rh O positive  Antibody screen neg  Rubella Immune  Varicella Non-Immune  RPR NR  HBsAg Neg  HIV NR  GC neg  Chlamydia neg  Genetic screening Not done  1 hour  GTT 107  3 hour GTT   GBS positive      Plan:  Jamie Gentry was discharged to home in good condition. Follow-up appointment with Dr. Elesa Massed in 5 weeks.  Discharge Medications: Allergies as of 07/26/2018   No Known Allergies     Medication List    STOP taking these medications   metroNIDAZOLE 500 MG tablet Commonly known as:  FLAGYL      TAKE these medications   multivitamin-prenatal 27-0.8 MG Tabs tablet Take 1 tablet by mouth daily at 12 noon.   norethindrone-ethinyl estradiol 1-20 MG-MCG tablet Commonly known as:  JUNEL FE,GILDESS FE,LOESTRIN FE Take 1 tablet by mouth daily.       Follow-up Information    Santhiago Collingsworth, Elenora Fender, MD Follow up in 5 week(s).   Specialty:  Obstetrics and Gynecology Why:  for IUD placment Contact information: [address] [place] [address] [phone]           Signed: ----- Ranae Plumber, MD Attending Obstetrician and Gynecologist [facility], Department of OB/GYN [facility]

## 2018-07-24 NOTE — ED Triage Notes (Signed)
Pt here via EMS from home with c/o dizziness, cough and congestion for the past few days. [redacted] weeks pregnant, mild abdominal cramping and lower back pain, no problems this pregnancy. Appears in NAD.

## 2018-07-24 NOTE — ED Provider Notes (Signed)
[facility] Emergency Department Provider Note  ____________________________________________   First MD Initiated Contact with Patient 07/24/18 [phone]     (approximate)  I have reviewed the triage vital signs and the nursing notes.   HISTORY  Chief Complaint Headache; Abdominal Cramping; and Dizziness   HPI Jamie Gentry is a 22 y.o. female who is in her third trimester pregnancy, due in 8 days, who is presenting the emergency department via EMS for upper respiratory symptoms as well as lightheadedness.  She says that she has had runny nose a bitemporal headache which is an 8 out of 10 as well as lightheadedness.  She says that she came into the emergency department additionally because she is having lower abdominal cramping as well as low back pain which she says is what she experienced at the beginning of her last labor.  Denies any rush of blood or fluid from the vagina.  Says that she has not felt her baby move yet this morning but says that she usually does not until 9 or 10 AM.  Denies any weakness or numbness.  Is taking prenatal vitamins.  Prenatal care at [facility].  No issues this pregnancy.  Denies any issues with previous pregnancies.  Patient denies a spinning sensation.   Past Medical History:  Diagnosis Date  . Trichomoniasis 07/15/2016    Patient Active Problem List   Diagnosis Date Noted  . Vaginal discharge during pregnancy in third trimester 06/03/2018  . Postpartum care following vaginal delivery 01/30/2017    History reviewed. No pertinent surgical history.  Prior to Admission medications   Medication Sig Start Date End Date Taking? Authorizing Provider  metroNIDAZOLE (FLAGYL) 500 MG tablet Take 1 tablet (500 mg total) by mouth every 12 (twelve) hours. 06/03/18   McVey, Prudencio Pair, CNM  Prenatal Vit-Fe Fumarate-FA (MULTIVITAMIN-PRENATAL) 27-0.8 MG TABS tablet Take 1 tablet by mouth daily at 12 noon.    [provider]     Allergies Patient has no known allergies.  No family history on file.  Social History Social History   Tobacco Use  . Smoking status: Former Games developer  . Smokeless tobacco: Never Used  Substance Use Topics  . Alcohol use: No  . Drug use: No    Review of Systems  Constitutional: No fever/chills Eyes: No visual changes. ENT: As above Cardiovascular: Denies chest pain. Respiratory: Denies shortness of breath. Gastrointestinal:   No nausea, no vomiting.  No diarrhea.  No constipation. Genitourinary: Negative for dysuria. Musculoskeletal: As above Skin: Negative for rash. Neurological: Negative for headaches, focal weakness or numbness.   ____________________________________________   PHYSICAL EXAM:  VITAL SIGNS: ED Triage Vitals  Enc Vitals Group     BP 07/24/18 0745 (!) 143/69     Pulse Rate 07/24/18 0745 (!) 105     Resp 07/24/18 0745 16     Temp 07/24/18 0745 97.9 F (36.6 C)     Temp Source 07/24/18 0745 Oral     SpO2 --      Weight 07/24/18 0748 205 lb (93 kg)     Height 07/24/18 0748 5\' 3"  (1.6 m)     Head Circumference --      Peak Flow --      Pain Score 07/24/18 0748 9     Pain Loc --      Pain Edu? --      Excl. in GC? --     Constitutional: Alert and oriented. Well appearing and in no acute  distress. Eyes: Conjunctivae are normal.  Head: Atraumatic.  Normal TMs bilaterally. Nose: Clear rhinorrhea to bilateral naris.  Mild tenderness palpation to the bilateral frontotemporal region. Mouth/Throat: Mucous membranes are moist.  No pharyngeal erythema.  No tonsillar swelling or exudate. Neck: No stridor.   Cardiovascular: Tachycardic, regular rhythm. Grossly normal heart sounds.   Respiratory: Normal respiratory effort.  No retractions. Lungs CTAB. Gastrointestinal: Soft and nontender.  Gravid abdomen. Musculoskeletal: No lower extremity tenderness nor edema.  No joint effusions. Neurologic:  Normal speech and language. No gross focal neurologic  deficits are appreciated. Skin:  Skin is warm, dry and intact. No rash noted. Psychiatric: Mood and affect are normal. Speech and behavior are normal.  ____________________________________________   LABS (all labs ordered are listed, but only abnormal results are displayed)  Labs Reviewed - No data to display ____________________________________________  EKG   ____________________________________________  RADIOLOGY   ____________________________________________   PROCEDURES  Procedure(s) performed:   Procedures  Critical Care performed:   ____________________________________________   INITIAL IMPRESSION / ASSESSMENT AND PLAN / ED COURSE  Pertinent labs & imaging results that were available during my care of the patient were reviewed by me and considered in my medical decision making (see chart for details).  DDX: Sinusitis, URI, labor, contractions, lightheadedness, vertigo As part of my medical decision making, I reviewed the following data within the electronic MEDICAL RECORD NUMBER Notes from prior ED visits  ----------------------------------------- 8:11 AM on 07/24/2018 -----------------------------------------  Patient cleared medically.  Lightheadedness and other upper respiratory symptoms likely viral.  However, I do believe the patient should be transferred to labor and delivery for further monitoring.  Discussed case Dr. Gaynelle Arabian.  Aware of patient being discharged in the emergency department and being sent to labor and delivery. ____________________________________________   FINAL CLINICAL IMPRESSION(S) / ED DIAGNOSES  Third trimester abdominal pain.  URI.  Sinusitis.   NEW MEDICATIONS STARTED DURING THIS VISIT:  New Prescriptions   No medications on file     Note:  This document was prepared using Dragon voice recognition software and may include unintentional dictation errors.     Myrna Blazer, MD 07/24/18 [phone]

## 2018-07-24 NOTE — Progress Notes (Signed)
intrapartrum progress note  S; painful contractions, fighting the urge to push  O: BP (!) 118/98   Pulse 100   Temp 98.1 F (36.7 C) (Oral)   Resp 18   Ht 5\' 3"  (1.6 m)   Wt 93 kg   LMP 10/25/2017 (Exact Date)   SpO2 97%   BMI 36.31 kg/m   FHT: 140 mod + accels + occasional variables and lates TOCO: q2-57min  SVE: 8/100/-1 AROMd bulging bag for clear fluid  A/P: 22yo [id number] @ 38+6 with labor   1. Labor augmented with pitocin.  Pitocin stopped currently, engaging PIT-STOP and clearing receptors in anticipation of soon vaginal delivery and need for pitocin for hemorrhage prophylaxis.  2. Category 2 tracing - continue various interventions PRN.  With accels, O2 delivery assured.  3. GBS PCR returned positive, and PCN abx given.    Anticipate delivery soon.  ----- Ranae Plumber, MD Attending Obstetrician and Gynecologist [facility], Department of OB/GYN [facility]

## 2018-07-24 NOTE — H&P (Signed)
OB History & Physical   History of Present Illness:  Chief Complaint:   HPI:  Jamie Gentry is a 22 y.o. G30P1002 female at [redacted]w[redacted]d dated by  Patient's last menstrual period was 10/25/2017 (exact date).  Estimated Date of Delivery: 08/01/18  She presents to L&D with painful contractions, worsening over the last few days  +FM, + CTX, no LOF, no VB  Pregnancy Issues: 1. Short interpregnancy interval - last delivery <44mos ago 2. Anemia - Hb 8.9 prior to admission 3. Varicella non-immune 4. Insufficient prenatal care - 5 prenatal visits 5. 2x uncomplicated SVD  Maternal Medical History:   Past Medical History:  Diagnosis Date  . Trichomoniasis 07/15/2016    History reviewed. No pertinent surgical history.  No Known Allergies  Prior to Admission medications   Medication Sig Start Date End Date Taking? Authorizing Provider  Prenatal Vit-Fe Fumarate-FA (MULTIVITAMIN-PRENATAL) 27-0.8 MG TABS tablet Take 1 tablet by mouth daily at 12 noon.   Yes [provider]  metroNIDAZOLE (FLAGYL) 500 MG tablet Take 1 tablet (500 mg total) by mouth every 12 (twelve) hours. 06/03/18   McVey, Prudencio Pair, CNM     Prenatal care site:  [facility]   Social History: She  reports that she has quit smoking. She has never used smokeless tobacco. She reports that she does not drink alcohol or use drugs.  2 kids Zane Herald, +involved FOB, parents live nearby.  +transportation difficulties  Family History: no history of HTN, GYN cancers.  Everyone is healthy.   Review of Systems: A full review of systems was performed and negative except as noted in the HPI.     Physical Exam:  Vital Signs: BP 130/71 (BP Location: Left Arm)   Pulse (!) 103   Temp 98.4 F (36.9 C) (Oral)   Resp 16   Ht 5\' 3"  (1.6 m)   Wt 93 kg   LMP  (LMP Unknown)   BMI 36.31 kg/m  General: no acute distress.  HEENT: normocephalic, atraumatic Heart: regular rate & rhythm.  No murmurs/rubs/gallops Lungs: clear  to auscultation bilaterally, normal respiratory effort Abdomen: soft, gravid, non-tender;  EFW: 7.5 Pelvic:   External: Normal external female genitalia  Cervix: Dilation: 4 / Effacement (%): 70 /   -3   Extremities: non-tender, symmetric, no edema bilaterally.  DTRs: 2+  Neurologic: Alert & oriented x 3.     Pertinent Results:  Prenatal Labs: Blood type/Rh O positive  Antibody screen neg  Rubella Immune  Varicella Non-Immune  RPR NR  HBsAg Neg  HIV NR  GC neg  Chlamydia neg  Genetic screening Not done  1 hour GTT 107  3 hour GTT   GBS pending   Declined TDaP No history of PAP  FHT: 125 mod, +accels, no decels TOCO: q39min  SVE:  Dilation: 4 / Effacement (%): 70 /   -3   Cephalic by leopolds   Assessment:  Jamie Gentry is a 22 y.o. G50P1002 female at [redacted]w[redacted]d with spontaneous labor.   Plan:  1. Admit to Labor & Delivery 2. CBC, T&S, Clrs, IVF 3. GBS unknown at this moment - will get labs from [facility] or [facility] and give abx if indicated  4. Consents obtained. 5. Continuous efm/toco 6. Category 1 tracing  7. Expectant management.   Formula feeding Depo at discharge - to discuss LARC with [facility]  ----- Ranae Plumber, MD Attending Obstetrician and Gynecologist [facility], Department of OB/GYN [facility]

## 2018-07-24 NOTE — OB Triage Note (Signed)
Pt. presented to triage reporting lower abdominal region and hip soreness, rated 9/10, that began a few days ago and has increased to "contraction-like pain" today. Pt. reports intermittent contractions. She was cleared from ED with symptoms of nasal discharge, cough, and congestion. She also reports vomiting x1 this am-moderate emesis. She reports positive fetal movement and no bleeding or leaking of fluid. Last intercourse last week. Vitals stable. Will continue to monitor.

## 2018-07-25 LAB — CBC
HEMATOCRIT: 29.5 % — AB (ref 36.0–46.0)
Hemoglobin: 9.7 g/dL — ABNORMAL LOW (ref 12.0–15.0)
MCH: 28.1 pg (ref 26.0–34.0)
MCHC: 32.9 g/dL (ref 30.0–36.0)
MCV: 85.5 fL (ref 80.0–100.0)
Platelets: 155 10*3/uL (ref 150–400)
RBC: 3.45 MIL/uL — ABNORMAL LOW (ref 3.87–5.11)
RDW: 13.5 % (ref 11.5–15.5)
WBC: 8.2 10*3/uL (ref 4.0–10.5)
nRBC: 0 % (ref 0.0–0.2)

## 2018-07-25 LAB — RPR: RPR Ser Ql: NONREACTIVE

## 2018-07-25 MED ORDER — VARICELLA VIRUS VACCINE LIVE 1350 PFU/0.5ML IJ SUSR: 0.5000 mL | INTRAMUSCULAR | Status: DC | PRN

## 2018-07-25 MED ORDER — COCONUT OIL OIL: 1.0000 "application " | TOPICAL_OIL | Status: DC | PRN

## 2018-07-25 MED ORDER — SIMETHICONE 80 MG PO CHEW: 80.0000 mg | CHEWABLE_TABLET | ORAL | Status: DC | PRN

## 2018-07-25 MED ORDER — INFLUENZA VAC SPLIT QUAD 0.5 ML IM SUSY: 0.5000 mL | PREFILLED_SYRINGE | INTRAMUSCULAR | Status: DC | PRN

## 2018-07-25 MED ORDER — ACETAMINOPHEN 500 MG PO TABS: 1000.0000 mg | ORAL_TABLET | Freq: Four times a day (QID) | ORAL | Status: DC | PRN

## 2018-07-25 MED ORDER — PRENATAL MULTIVITAMIN CH
1.0000 | ORAL_TABLET | Freq: Every day | ORAL | Status: DC
  Administered 2018-07-25 – 2018-07-26 (×2): 1 via ORAL
  Filled 2018-07-25: qty 1

## 2018-07-25 MED ORDER — BENZOCAINE-MENTHOL 20-0.5 % EX AERO: 1.0000 "application " | INHALATION_SPRAY | CUTANEOUS | Status: DC | PRN

## 2018-07-25 MED ORDER — DIPHENHYDRAMINE HCL 25 MG PO CAPS: 25.0000 mg | ORAL_CAPSULE | Freq: Four times a day (QID) | ORAL | Status: DC | PRN

## 2018-07-25 MED ORDER — IBUPROFEN 600 MG PO TABS
600.0000 mg | ORAL_TABLET | Freq: Four times a day (QID) | ORAL | Status: DC
  Administered 2018-07-25 – 2018-07-26 (×7): 600 mg via ORAL
  Filled 2018-07-25 (×7): qty 1

## 2018-07-25 MED ORDER — WITCH HAZEL-GLYCERIN EX PADS: 1.0000 "application " | MEDICATED_PAD | CUTANEOUS | Status: DC

## 2018-07-25 MED ORDER — DOCUSATE SODIUM 100 MG PO CAPS
100.0000 mg | ORAL_CAPSULE | Freq: Two times a day (BID) | ORAL | Status: DC
  Administered 2018-07-25 – 2018-07-26 (×3): 100 mg via ORAL
  Filled 2018-07-25 (×3): qty 1

## 2018-07-25 MED ORDER — DIBUCAINE 1 % RE OINT: 1.0000 "application " | TOPICAL_OINTMENT | RECTAL | Status: DC | PRN

## 2018-07-25 MED ORDER — MEDROXYPROGESTERONE ACETATE 150 MG/ML IM SUSP: 150.0000 mg | INTRAMUSCULAR | Status: DC | PRN

## 2018-07-25 MED ORDER — ONDANSETRON HCL 4 MG PO TABS: 4.0000 mg | ORAL_TABLET | ORAL | Status: DC | PRN

## 2018-07-25 MED ORDER — FERROUS SULFATE 325 (65 FE) MG PO TABS
325.0000 mg | ORAL_TABLET | Freq: Two times a day (BID) | ORAL | Status: DC
  Administered 2018-07-25 – 2018-07-26 (×4): 325 mg via ORAL
  Filled 2018-07-25 (×4): qty 1

## 2018-07-25 MED ORDER — ONDANSETRON HCL 4 MG/2ML IJ SOLN: 4.0000 mg | INTRAMUSCULAR | Status: DC | PRN

## 2018-07-25 NOTE — Progress Notes (Signed)
Post Partum Day 1  Subjective: Doing well, no concerns. Ambulating without difficulty, pain managed with PO meds, tolerating regular diet, and voiding without difficulty.   No fever/chills, chest pain, shortness of breath, nausea/vomiting, or leg pain. No nipple or breast pain.   Objective: BP (!) 119/58 (BP Location: Left Arm)   Pulse 72   Temp 97.7 F (36.5 C) (Oral)   Resp 18   Ht 5\' 3"  (1.6 m)   Wt 93 kg   LMP 10/25/2017 (Exact Date)   SpO2 98%   Breastfeeding? Unknown   BMI 36.31 kg/m    Physical Exam:  General: alert, cooperative, appears stated age and fatigued CV: RRR Pulm: nl effort, CTABL Abdomen: soft, non-tender, active bowel sounds Uterine Fundus: firm Lochia: appropriate DVT Evaluation: No evidence of DVT seen on physical exam. No cords or calf tenderness. No significant calf/ankle edema.  Recent Labs    07/24/18 1227 07/25/18 0641  HGB 10.0* 9.7*  HCT 30.2* 29.5*  WBC 5.0 8.2  PLT 145* 155    Assessment/Plan: 22 y.o. [id number] postpartum day # 1  -Continue routine PP care -Encouraged snug fitting bra, cold application, and Tylenol PRN for engorgement for formula feeding  -Acute blood loss anemia - hemodynamically stable and asymptomatic; start PO ferrous sulfate BID with stool softeners  -Immunization status: Needs varicella and flu prior to discharge  Disposition: Continue inpatient postpartum care.    LOS: 1 day   Genia Del, CNM 07/25/2018, 8:40 AM   ----- Genia Del Certified Nurse Midwife [facility]

## 2018-07-26 MED ORDER — NORETHIN ACE-ETH ESTRAD-FE 1-20 MG-MCG PO TABS: 1.0000 | ORAL_TABLET | Freq: Every day | ORAL | 11 refills | Status: DC

## 2018-07-26 NOTE — Progress Notes (Signed)
Patient declined wanting the depo shot, flu shot, and varicella vaccine. Patient educated on the risk and benefits of having all 3 shots. Patient stated she did not want any shots today, she would think about it and may get them at her 5 week follow up visit.

## 2018-07-26 NOTE — Discharge Instructions (Signed)
Discharge instructions:   Call office if you have any of the following: headache, visual changes, fever >101.0 F, chills, breast concerns, excessive vaginal bleeding, incision drainage or problems, leg pain or redness, depression or any other concerns.   Activity: Do not lift > 10 lbs for 6 weeks.  No intercourse or tampons for 6 weeks.  No driving for 1-2 weeks.   Call your doctor for increased pain or vaginal bleeding, temperature above 101.0, depression, or concerns.  No strenuous activity or heavy lifting for 6 weeks.  No intercourse, tampons, douching, or enemas for 6 weeks.  No tub baths-showers only.  No driving for 2 weeks or while taking pain medications.  Continue prenatal vitamin and iron.  Increase calories and fluids while breastfeeding.  You may have a slight fever when your milk comes in, but it should go away on its own.  If it does not, and rises above 101.0 please call the doctor.  For concerns about your baby, please call your pediatrician For breastfeeding concerns, the lactation consultant can be reached at [phone]  Please return for IUD placement and postpartum check with Dr. Elesa Massed in 5 weeks.  The IUD has been ordered and will be ready for you at your visit. DO NOT start your birth control pills until at least 1 month from now, if at all.

## 2018-07-26 NOTE — Progress Notes (Signed)
Patient discharged home with infant. Discharge instructions, prescriptions and follow up appointment given to and reviewed with patient. Patient verbalized understanding. Patient wheeled out with infant by auxiliary.  

## 2019-01-23 ENCOUNTER — Emergency Department
Admission: EM | Admit: 2019-01-23 | Discharge: 2019-01-23 | Disposition: A | Discharge status: Home / Self Care | Payer: Medicaid Other | Attending: Emergency Medicine | Admitting: Emergency Medicine

## 2019-01-23 ENCOUNTER — Encounter: Payer: Self-pay | Admitting: Emergency Medicine

## 2019-01-23 ENCOUNTER — Other Ambulatory Visit: Payer: Self-pay

## 2019-01-23 DIAGNOSIS — H0014 Chalazion left upper eyelid: Secondary | ICD-10-CM | POA: Diagnosis not present

## 2019-01-23 DIAGNOSIS — Z87891 Personal history of nicotine dependence: Secondary | ICD-10-CM | POA: Insufficient documentation

## 2019-01-23 DIAGNOSIS — H5712 Ocular pain, left eye: Secondary | ICD-10-CM | POA: Diagnosis present

## 2019-01-23 MED ORDER — KETOROLAC TROMETHAMINE 0.5 % OP SOLN: 1.0000 [drp] | Freq: Four times a day (QID) | OPHTHALMIC | 0 refills | Status: DC

## 2019-01-23 MED ORDER — ERYTHROMYCIN 5 MG/GM OP OINT: 1.0000 "application " | TOPICAL_OINTMENT | Freq: Every day | OPHTHALMIC | 0 refills | Status: DC

## 2019-01-23 MED ORDER — HYDROCODONE-ACETAMINOPHEN 5-325 MG PO TABS
1.0000 | ORAL_TABLET | Freq: Once | ORAL | Status: AC
  Administered 2019-01-23: 1 via ORAL
  Filled 2019-01-23: qty 1

## 2019-01-23 NOTE — ED Triage Notes (Signed)
Pt sent here for eye problem. Unsure of what patient is reporting eye problem dx is.  Pt was supposed to have surgery but something ruptured yesterday. Now patient has 10/10 pain in eye.  Pt reports charles drew told her to come to ED because could cause blindness.  Pt c/o significant blurry in left eye.  Pt reports mucous came out of eye.

## 2019-01-23 NOTE — ED Provider Notes (Signed)
[facility] Emergency Department Provider Note  ____________________________________________  Time seen: Approximately 2:50 PM  I have reviewed the triage vital signs and the nursing notes.   HISTORY  Chief Complaint Eye Problem    HPI Jamie Gentry is a 23 y.o. female who presents the emergency department complaining of left eye pain.  Patient presented initially complaining of left eye pain, visual changes, drainage from the left eye.   With further questioning, patient reports that she had chalazion to the left upper eyelid.  Patient reports that this was "much larger" yesterday and then it has shrunk in size with purulent drainage from underneath the eyelid.  She denies any frank back pain and states that all pain is in the eyelid.  No other complaints at this time.  No medications for this complaint prior to arrival.        Past Medical History:  Diagnosis Date  . Trichomoniasis 07/15/2016    Patient Active Problem List   Diagnosis Date Noted  . Labor and delivery indication for care or intervention 07/24/2018  . Vaginal discharge during pregnancy in third trimester 06/03/2018  . Postpartum care following vaginal delivery 01/30/2017    History reviewed. No pertinent surgical history.  Prior to Admission medications   Medication Sig Start Date End Date Taking? Authorizing Provider  erythromycin ophthalmic ointment Place 1 application into the left eye at bedtime. 01/23/19   Linus Weckerly, Delorise Royals, PA-C  ketorolac (ACULAR) 0.5 % ophthalmic solution Place 1 drop into the right eye 4 (four) times daily. 01/23/19   Tynisha Ogan, Delorise Royals, PA-C  norethindrone-ethinyl estradiol (JUNEL FE,GILDESS FE,LOESTRIN FE) 1-20 MG-MCG tablet Take 1 tablet by mouth daily. 07/26/18 07/26/19  Ward, Elenora Fender, MD    Allergies Patient has no known allergies.  History reviewed. No pertinent family history.  Social History Social History   Tobacco Use  . Smoking  status: Former Games developer  . Smokeless tobacco: Never Used  Substance Use Topics  . Alcohol use: No  . Drug use: No     Review of Systems  Constitutional: No fever/chills Eyes: No visual changes.  Positive for left upper eyelid pain.  Positive for drainage from underneath eyelid. ENT: No upper respiratory complaints. Cardiovascular: no chest pain. Respiratory: no cough. No SOB. Gastrointestinal: No abdominal pain.  No nausea, no vomiting.  Musculoskeletal: Negative for musculoskeletal pain. Skin: Negative for rash, abrasions, lacerations, ecchymosis. Neurological: Negative for headaches, focal weakness or numbness. 10-point ROS otherwise negative.  ____________________________________________   PHYSICAL EXAM:  VITAL SIGNS: ED Triage Vitals [01/23/19 1437]  Enc Vitals Group     BP 139/85     Pulse Rate (!) 103     Resp 20     Temp 98.3 F (36.8 C)     Temp Source Oral     SpO2 100 %     Weight 170 lb (77.1 kg)     Height 5\' 3"  (1.6 m)     Head Circumference      Peak Flow      Pain Score 10     Pain Loc      Pain Edu?      Excl. in GC?      Constitutional: Alert and oriented. Well appearing and in no acute distress. Eyes: Conjunctivae are normal. PERRL. EOMI. visualization of the left upper eyelid reveals findings consistent with chalazion which is known.  No gross erythema, edema of the eyelid.  No purulent drainage noted at this time.  Funduscopic  exam reveals red reflex bilaterally, vasculature and optic disc is unremarkable bilaterally. Head: Atraumatic.  Neck: No stridor.    Cardiovascular: Normal rate, regular rhythm. Normal S1 and S2.  Good peripheral circulation. Respiratory: Normal respiratory effort without tachypnea or retractions. Lungs CTAB. Good air entry to the bases with no decreased or absent breath sounds. Musculoskeletal: Full range of motion to all extremities. No gross deformities appreciated. Neurologic:  Normal speech and language. No gross focal  neurologic deficits are appreciated.  Skin:  Skin is warm, dry and intact. No rash noted. Psychiatric: Mood and affect are normal. Speech and behavior are normal. Patient exhibits appropriate insight and judgement.   ____________________________________________   LABS (all labs ordered are listed, but only abnormal results are displayed)  Labs Reviewed - No data to display ____________________________________________  EKG   ____________________________________________  RADIOLOGY   No results found.  ____________________________________________    PROCEDURES  Procedure(s) performed:    Procedures    Medications  HYDROcodone-acetaminophen (NORCO/VICODIN) 5-325 MG per tablet 1 tablet (1 tablet Oral Given 01/23/19 1458)     ____________________________________________   INITIAL IMPRESSION / ASSESSMENT AND PLAN / ED COURSE  Pertinent labs & imaging results that were available during my care of the patient were reviewed by me and considered in my medical decision making (see chart for details).  Review of the [place] CSRS was performed in accordance of the [facility] prior to dispensing any controlled drugs.           Patient's diagnosis is consistent with chalazion.  Patient presented to the emergency department with a known chalazion to the left upper eye.  Evidently this was much larger until today.  Patient does have pain to the area, reported purulent drainage from underneath the eyelid.  No drainage noted at this time.  Patient does have the findings consistent with chalazion which is tender to palpation.  No surrounding findings consistent with cellulitis concerning for preseptal or periorbital cellulitis.  At this time, patient will be prescribed erythromycin ointment, Acular for symptom relief.  Patient is to follow-up with ophthalmology.  . Patient is given ED precautions to return to the ED for any worsening or new  symptoms.     ____________________________________________  FINAL CLINICAL IMPRESSION(S) / ED DIAGNOSES  Final diagnoses:  Chalazion of left upper eyelid      NEW MEDICATIONS STARTED DURING THIS VISIT:  ED Discharge Orders         Ordered    erythromycin ophthalmic ointment  Daily at bedtime     01/23/19 1453    ketorolac (ACULAR) 0.5 % ophthalmic solution  4 times daily     01/23/19 1453              This chart was dictated using voice recognition software/Dragon. Despite best efforts to proofread, errors can occur which can change the meaning. Any change was purely unintentional.    Racheal PatchesCuthriell, Jossalyn Forgione D, PA-C 01/23/19 1614    Jene EveryKinner, Robert, MD 04/10/20 [phone]

## 2019-01-23 NOTE — ED Notes (Signed)
See triage note  Presents with left eye pain  States she has had a stye to left upper eyelid for about 2 years  States she did not have any pain at that time  Then yesterday the past "popped'  And developed increased pain  States she was told by her PCP that she may have a chalazion  Left eye is red and draining

## 2019-04-29 ENCOUNTER — Encounter: Payer: Self-pay | Admitting: Emergency Medicine

## 2019-04-29 ENCOUNTER — Other Ambulatory Visit: Payer: Self-pay

## 2019-04-29 ENCOUNTER — Emergency Department
Admission: EM | Admit: 2019-04-29 | Discharge: 2019-04-29 | Disposition: A | Discharge status: Home / Self Care | Payer: Medicaid Other | Attending: Emergency Medicine | Admitting: Emergency Medicine

## 2019-04-29 DIAGNOSIS — Z5321 Procedure and treatment not carried out due to patient leaving prior to being seen by health care provider: Secondary | ICD-10-CM | POA: Insufficient documentation

## 2019-04-29 DIAGNOSIS — H9203 Otalgia, bilateral: Secondary | ICD-10-CM | POA: Diagnosis present

## 2019-04-29 NOTE — ED Triage Notes (Addendum)
Pt reports bilateral ear pain for 2 weeks; denies fever; says she is having trouble hearing out of both ears; says she's here tonight because she found someone to watch her babies; pt in no acute distress-continually eating food from [facility];

## 2019-05-27 IMAGING — US US OB COMP +14 WK
1 series · 14 of 28 positions shown · non-contrast
Comparison: none

CLINICAL DATA: Pregnancy.

EXAM:
OBSTETRICAL ULTRASOUND >14 WKS

[Series 1: us ob comp +14 wk · 0.28mm/px · 14 of 79 slices shown]
[im 3/79]
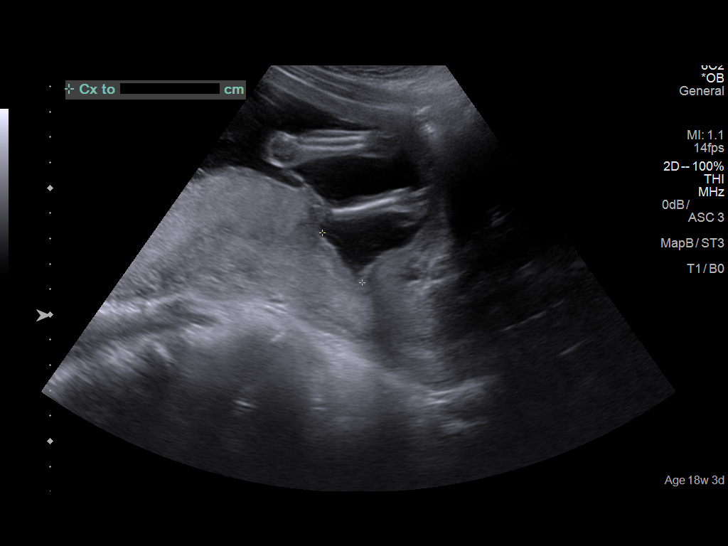
[im 9/79]
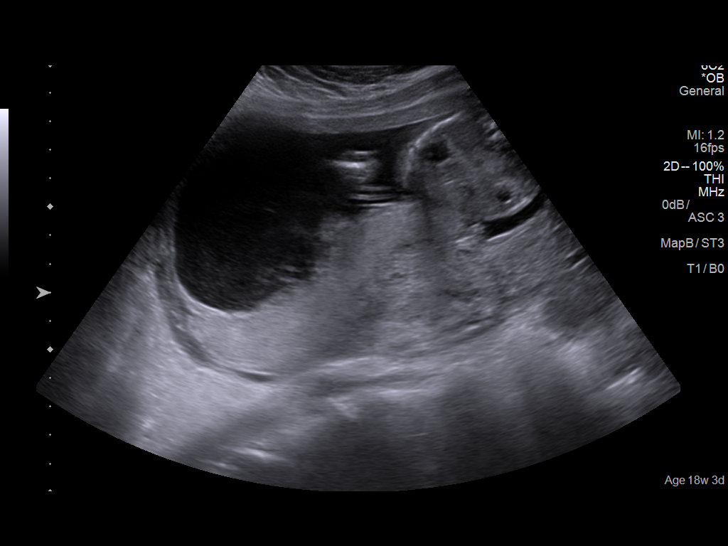
[im 15/79]
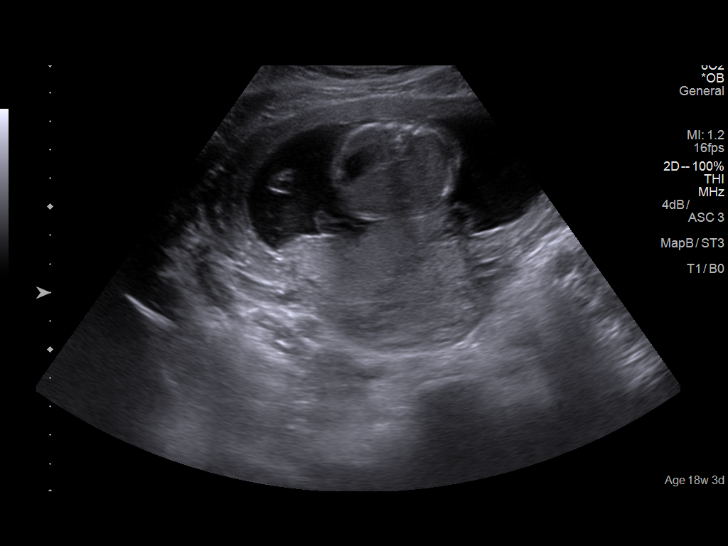
[im 21/79]
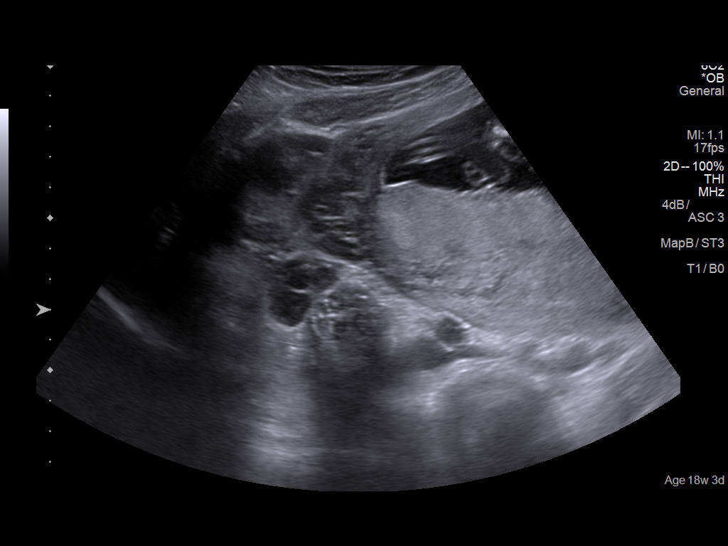
[im 27/79]
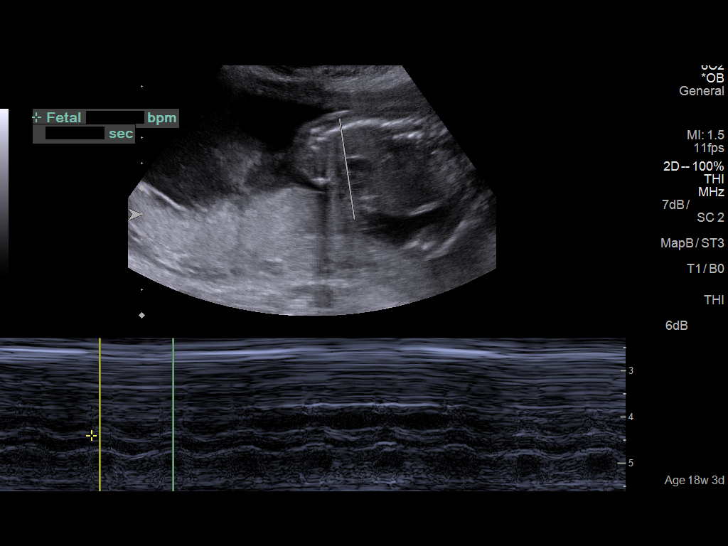
[im 32/79]
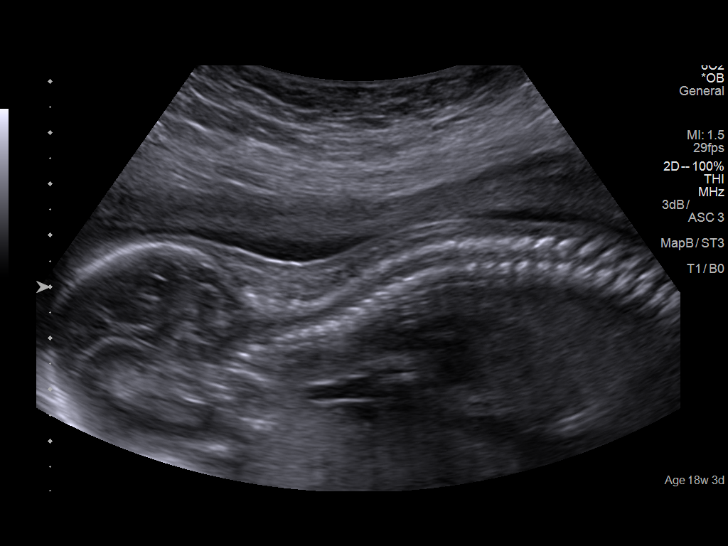
[im 38/79]
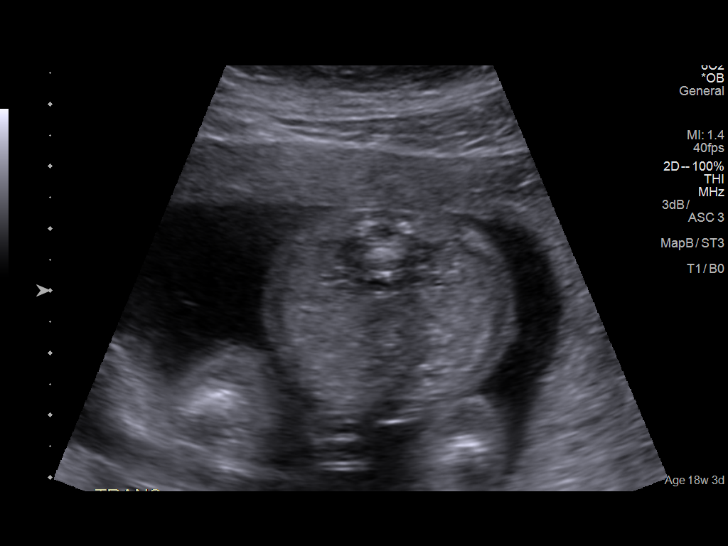
[im 44/79]
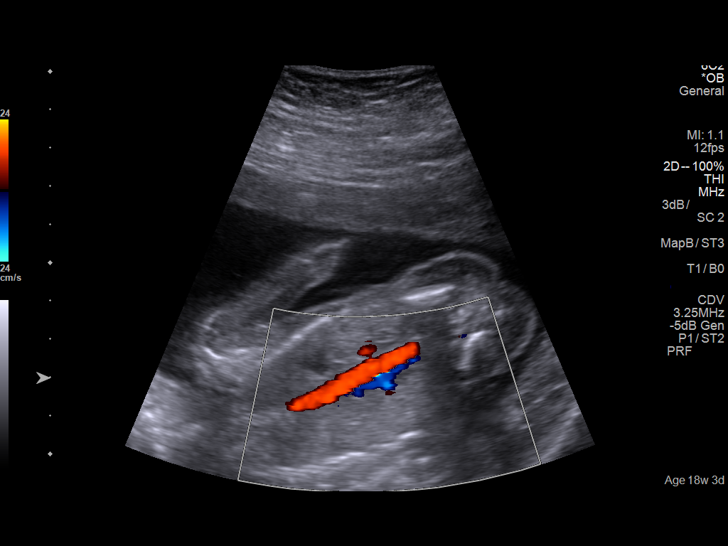
[im 50/79]
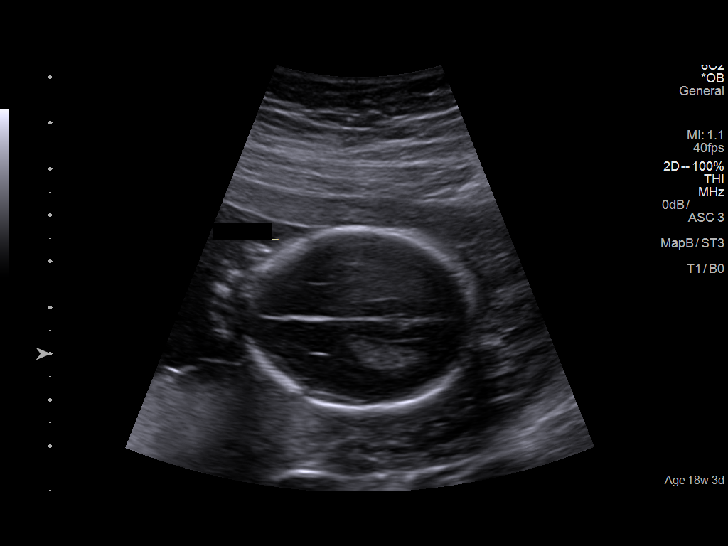
[im 55/79]
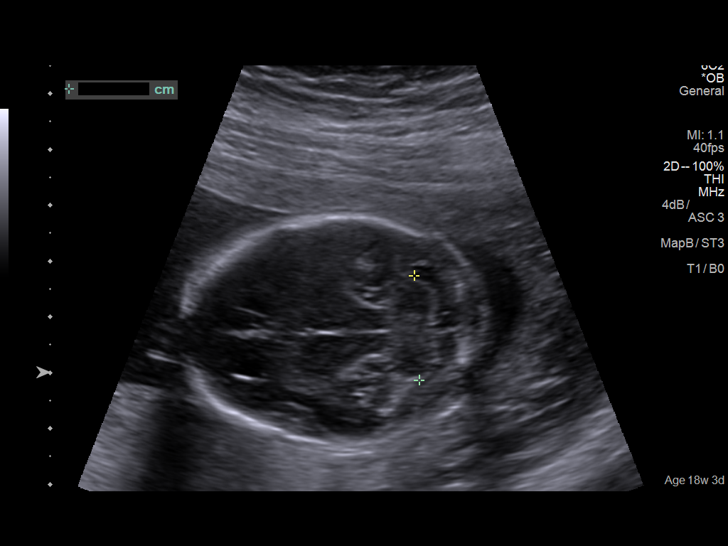
[im 61/79]
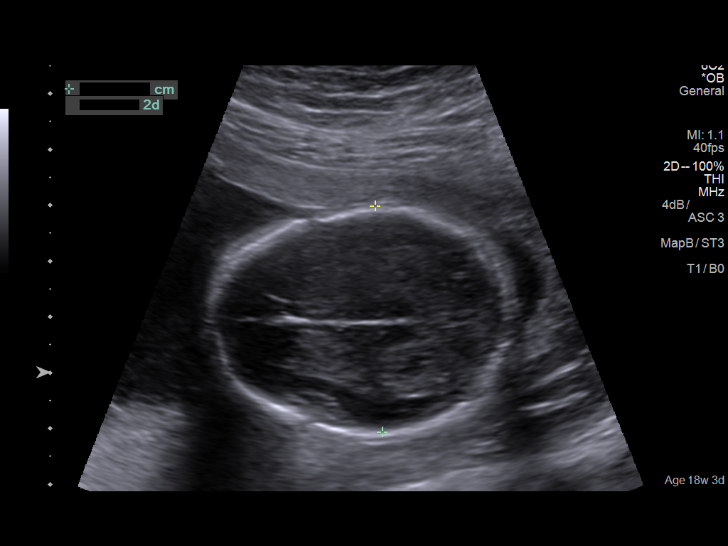
[im 67/79]
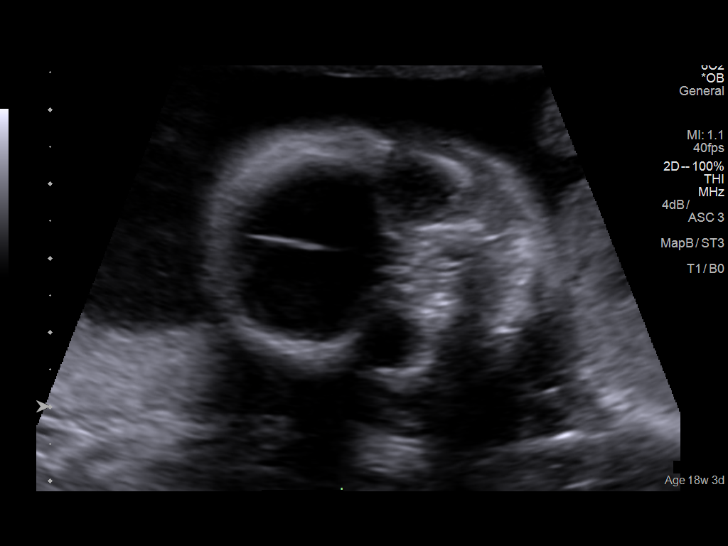
[im 73/79]
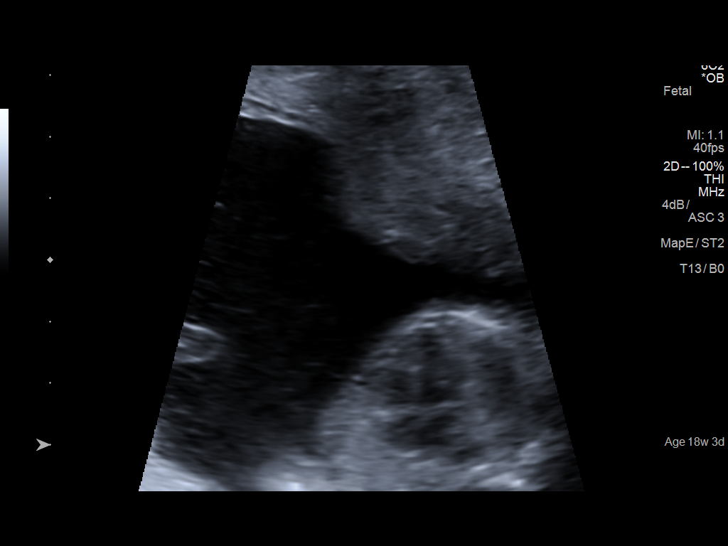
[im 79/79]
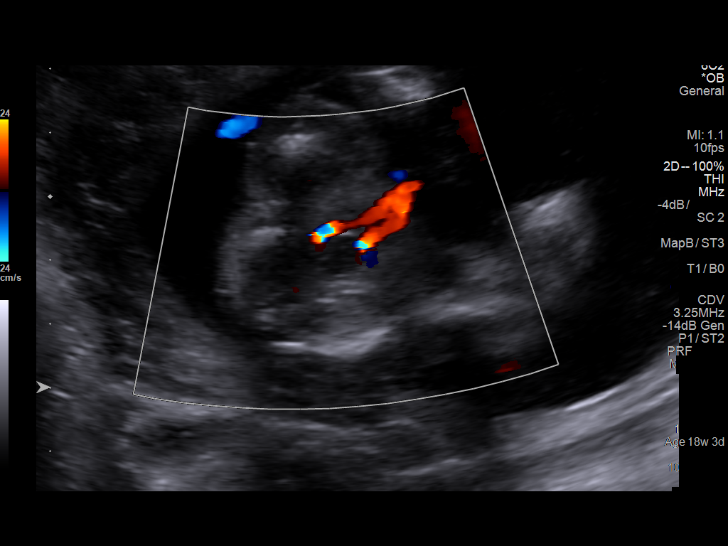

[14 of 28 positions shown; findings below may reference images not displayed]

FINDINGS: Number of Fetuses: 1

Heart Rate:  140 bpm

Movement: Present

Presentation: Breech

Previa: No

Placental Location: Posterior

Amniotic Fluid (Subjective): Normal

Amniotic Fluid (Objective):

Vertical pocket 4.5cm

FETAL BIOMETRY

BPD:  4.1cm 18w 2d

HC:    153cm 18w 2d

AC:   143cm 19w 5d

FL:   3.1cm 20w 0d

Current Mean GA: 19w d              US EDC: 07/27/2018

FETAL ANATOMY

Lateral Ventricles: Visualized

Thalami/CSP: Visualized

Posterior Fossa:  Visualized

Nuchal Region: Visualized

Upper Lip: Visualized

Spine: Visualized

4 Chamber Heart on Left: Visualized

LVOT: Visualized

RVOT: Visualized

Stomach on Left: Visualized

3 Vessel Cord: Visualized

Cord Insertion site: Visualized

Kidneys: Visualized

Bladder: Visualized

Extremities: Visualized

Maternal Findings:

Cervix:  0.9 cm.  Closed.
IMPRESSION: Single viable intrauterine pregnancy at 19 weeks 1 day. Breech
presentation.

## 2019-08-07 ENCOUNTER — Other Ambulatory Visit: Payer: Self-pay

## 2019-08-07 ENCOUNTER — Ambulatory Visit: Payer: Self-pay | Admitting: Advanced Practice Midwife

## 2019-08-07 DIAGNOSIS — Z5321 Procedure and treatment not carried out due to patient leaving prior to being seen by health care provider: Secondary | ICD-10-CM

## 2019-08-07 NOTE — Progress Notes (Signed)
RN to waiting room to call patient to clinic around 10:05. Both large and small waiting rooms as well as restroom all empty of patients. RN checked with Butch Penny at the  reception desk stating she did check in this patient and remembers a female getting on the elevator but is unsure if this was the same patient. RN to waiting room again x 3 waiting room with one female only. Hal Morales, RN

## 2019-09-19 ENCOUNTER — Other Ambulatory Visit: Payer: Self-pay

## 2019-09-19 DIAGNOSIS — Z20822 Contact with and (suspected) exposure to covid-19: Secondary | ICD-10-CM

## 2019-09-21 LAB — NOVEL CORONAVIRUS, NAA: SARS-CoV-2, NAA: NOT DETECTED

## 2019-10-11 ENCOUNTER — Ambulatory Visit: Payer: Medicaid Other | Admitting: Physician Assistant

## 2019-10-11 ENCOUNTER — Encounter: Payer: Self-pay | Admitting: Physician Assistant

## 2019-10-11 ENCOUNTER — Other Ambulatory Visit: Payer: Self-pay

## 2019-10-11 DIAGNOSIS — A5901 Trichomonal vulvovaginitis: Secondary | ICD-10-CM | POA: Diagnosis not present

## 2019-10-11 DIAGNOSIS — Z113 Encounter for screening for infections with a predominantly sexual mode of transmission: Secondary | ICD-10-CM | POA: Diagnosis not present

## 2019-10-11 LAB — WET PREP FOR TRICH, YEAST, CLUE
Trichomonas Exam: POSITIVE — AB
Yeast Exam: NEGATIVE

## 2019-10-11 MED ORDER — METRONIDAZOLE 500 MG PO TABS: 2000.0000 mg | ORAL_TABLET | Freq: Once | ORAL | 0 refills | Status: AC

## 2019-10-11 NOTE — Progress Notes (Signed)
Wet mount reviewed and pt treated for Trich per standing order and per provider verbal order. Provider orders completed.Ronny Bacon, RN

## 2019-10-11 NOTE — Progress Notes (Signed)
  [facility] Department STI clinic/screening visit  Subjective:  Jamie Gentry is a 23 y.o. female being seen today for an STI screening visit. The patient reports they do have symptoms.  Patient reports that they do not desire a pregnancy in the next year.   They reported they are not interested in discussing contraception today.  Patient's last menstrual period was 10/10/2019 (exact date).   Patient has the following medical conditions:   Patient Active Problem List   Diagnosis Date Noted  . Labor and delivery indication for care or intervention 07/24/2018  . Vaginal discharge during pregnancy in third trimester 06/03/2018  . Postpartum care following vaginal delivery 01/30/2017    Chief Complaint  Patient presents with  . SEXUALLY TRANSMITTED DISEASE    STD Screening    HPI  Patient reports that she has had some back pain for about 1 week.  Patient points to her lower back across sacrum.  Denies any vaginal symptoms.  LMP 10/10/2019 and normal.  Denies chronic conditions and history of any surgeries.  See flowsheet for further details and programmatic requirements.    The following portions of the patient's history were reviewed and updated as appropriate: allergies, current medications, past medical history, past social history, past surgical history and problem list.  Objective:  There were no vitals filed for this visit.  Physical Exam Constitutional:      General: She is not in acute distress.    Appearance: Normal appearance. She is normal weight.  HENT:     Head: Normocephalic and atraumatic.     Comments: No nits, lice or hair loss. Eyes:     Conjunctiva/sclera: Conjunctivae normal.  Pulmonary:     Effort: Pulmonary effort is normal.  Neurological:     Mental Status: She is alert and oriented to person, place, and time.  Psychiatric:        Mood and Affect: Mood normal.        Behavior: Behavior normal.        Thought Content: Thought content  normal.        Judgment: Judgment normal.    Patient declines exam by provider due to having her 3 children in the room with her and was instructed how to self-collect her samples for GC/Chlamydia and wet mount.  Assessment and Plan:  Jamie Gentry is a 23 y.o. female presenting to the [facility] Department for STI screening  1. Screening for STD (sexually transmitted disease) Patient into clinic with symptom of back pain.  Declines blood work today and self-collects samples for [employer] and wet mount. Rec condoms with all sex. Await test results.  Counseled that RN will call if she needs to RTC for further treatment once results are back. - WET PREP FOR [place], YEAST, [place] [facility]  2. Vaginal trichomoniasis Will treat for Trich with Metronidazole 2 g po at one time with food, no EtOH for 24 hr before and until 72 hr after completing medicine. No sex for 7 days and until after partner/s complete treatment. RTC for re-treatment if vomits < 2 hr after taking medicine. - metroNIDAZOLE (FLAGYL) 500 MG tablet; Take 4 tablets (2,000 mg total) by mouth once for 1 dose.  Dispense: 4 tablet; Refill: 0     No follow-ups on file.  No future appointments.  Jerene Dilling, PA

## 2019-10-11 NOTE — Progress Notes (Signed)
Pt here for STD screening.Ivorie Uplinger, RN 

## 2019-10-23 ENCOUNTER — Ambulatory Visit: Payer: Medicaid Other

## 2019-10-23 ENCOUNTER — Other Ambulatory Visit: Payer: Self-pay

## 2019-10-23 DIAGNOSIS — A549 Gonococcal infection, unspecified: Secondary | ICD-10-CM

## 2019-10-23 DIAGNOSIS — A749 Chlamydial infection, unspecified: Secondary | ICD-10-CM

## 2019-10-23 MED ORDER — CEFTRIAXONE SODIUM 250 MG IJ SOLR
250.0000 mg | Freq: Once | INTRAMUSCULAR | Status: AC
  Administered 2019-10-23: 250 mg via INTRAMUSCULAR

## 2019-10-23 MED ORDER — AZITHROMYCIN 500 MG PO TABS
1000.0000 mg | ORAL_TABLET | Freq: Once | ORAL | Status: AC
  Administered 2019-10-23: 1000 mg via ORAL

## 2019-10-23 NOTE — Progress Notes (Signed)
Patient tx'd for GC and Chlamydia per SO Richmond Campbell, RN

## 2020-02-14 ENCOUNTER — Emergency Department: Payer: Medicaid Other

## 2020-02-14 ENCOUNTER — Emergency Department
Admission: EM | Admit: 2020-02-14 | Discharge: 2020-02-14 | Disposition: A | Discharge status: Home / Self Care | Payer: Medicaid Other | Attending: Student in an Organized Health Care Education/Training Program | Admitting: Student in an Organized Health Care Education/Training Program

## 2020-02-14 ENCOUNTER — Other Ambulatory Visit: Payer: Self-pay

## 2020-02-14 ENCOUNTER — Encounter: Payer: Self-pay | Admitting: Emergency Medicine

## 2020-02-14 DIAGNOSIS — O4691 Antepartum hemorrhage, unspecified, first trimester: Secondary | ICD-10-CM | POA: Diagnosis not present

## 2020-02-14 DIAGNOSIS — O99331 Smoking (tobacco) complicating pregnancy, first trimester: Secondary | ICD-10-CM | POA: Insufficient documentation

## 2020-02-14 DIAGNOSIS — F17210 Nicotine dependence, cigarettes, uncomplicated: Secondary | ICD-10-CM | POA: Insufficient documentation

## 2020-02-14 DIAGNOSIS — H1031 Unspecified acute conjunctivitis, right eye: Secondary | ICD-10-CM | POA: Diagnosis not present

## 2020-02-14 DIAGNOSIS — Z79899 Other long term (current) drug therapy: Secondary | ICD-10-CM | POA: Insufficient documentation

## 2020-02-14 DIAGNOSIS — Z3A00 Weeks of gestation of pregnancy not specified: Secondary | ICD-10-CM | POA: Insufficient documentation

## 2020-02-14 DIAGNOSIS — N939 Abnormal uterine and vaginal bleeding, unspecified: Secondary | ICD-10-CM

## 2020-02-14 DIAGNOSIS — O99891 Other specified diseases and conditions complicating pregnancy: Secondary | ICD-10-CM | POA: Insufficient documentation

## 2020-02-14 DIAGNOSIS — Z3201 Encounter for pregnancy test, result positive: Secondary | ICD-10-CM

## 2020-02-14 LAB — HCG, QUANTITATIVE, PREGNANCY: hCG, Beta Chain, Quant, S: 141 m[IU]/mL — ABNORMAL HIGH (ref <5)

## 2020-02-14 LAB — CBC
HCT: 34.7 % — ABNORMAL LOW (ref 36.0–46.0)
Hemoglobin: 11.8 g/dL — ABNORMAL LOW (ref 12.0–15.0)
MCH: 30.8 pg (ref 26.0–34.0)
MCHC: 34 g/dL (ref 30.0–36.0)
MCV: 90.6 fL (ref 80.0–100.0)
Platelets: 224 10*3/uL (ref 150–400)
RBC: 3.83 MIL/uL — ABNORMAL LOW (ref 3.87–5.11)
RDW: 12.4 % (ref 11.5–15.5)
WBC: 8.2 10*3/uL (ref 4.0–10.5)
nRBC: 0 % (ref 0.0–0.2)

## 2020-02-14 LAB — POCT PREGNANCY, URINE: Preg Test, Ur: POSITIVE — AB

## 2020-02-14 MED ORDER — FLUORESCEIN SODIUM 1 MG OP STRP
1.0000 | ORAL_STRIP | Freq: Once | OPHTHALMIC | Status: AC
  Administered 2020-02-14: 1 via OPHTHALMIC
  Filled 2020-02-14: qty 1

## 2020-02-14 MED ORDER — ERYTHROMYCIN 5 MG/GM OP OINT
1.0000 "application " | TOPICAL_OINTMENT | Freq: Four times a day (QID) | OPHTHALMIC | Status: DC
  Administered 2020-02-14: 1 via OPHTHALMIC
  Filled 2020-02-14: qty 1

## 2020-02-14 MED ORDER — ERYTHROMYCIN 5 MG/GM OP OINT: 1.0000 "application " | TOPICAL_OINTMENT | Freq: Four times a day (QID) | OPHTHALMIC | 0 refills | Status: DC

## 2020-02-14 MED ORDER — KETOROLAC TROMETHAMINE 0.5 % OP SOLN: 1.0000 [drp] | Freq: Four times a day (QID) | OPHTHALMIC | 0 refills | Status: AC | PRN

## 2020-02-14 MED ORDER — TETRACAINE HCL 0.5 % OP SOLN
2.0000 [drp] | Freq: Once | OPHTHALMIC | Status: AC
  Administered 2020-02-14: 2 [drp] via OPHTHALMIC
  Filled 2020-02-14: qty 4

## 2020-02-14 NOTE — ED Notes (Signed)
Pt with right eye pain since last night, pt present with right eyelid swollen and watery.

## 2020-02-14 NOTE — ED Notes (Signed)
Pt to US.

## 2020-02-14 NOTE — Discharge Instructions (Signed)
Return to the ED in 2 days for repeat beta quant.

## 2020-02-14 NOTE — ED Triage Notes (Signed)
Pt reports feels like something is in her right eye and it hurts. Pt right eye red and watery. Pt states symtpoms started last pm.  Pt also reports she thought she was pregnant but she started bleeding 4-5 days ago so wants to check. Pt reports last menstrual cycle was about 4 weeks ago. Denies pain. Pt requesting that her eye discharge papers and her other d/c papers be on separate sheets of paper and not together.

## 2020-02-14 NOTE — ED Provider Notes (Signed)
[facility] Emergency Department Provider Note ____________________________________________  Time seen: 1445  I have reviewed the triage vital signs and the nursing notes.  HISTORY  Chief Complaint  Eye Pain and Possible Pregnancy  HPI Jamie Gentry is a 24 y.o. female G5P2 presents with self to the ED with 2 unrelated complaints.  Patient describes right eye irritation, tearing, and foreign body sensation.  She describes onset  last night.  She denies any visual change, nausea, vomiting, diarrhea.  Patient also has a request for evaluation of vaginal bleeding.  Patient describes that she was pregnant, and took a pregnancy test last month.  She reports a normal menses about 8 weeks ago, and describes that her menses 4 weeks ago, was abnormal in nature.  She describes over the last 4 to 5 days, she has had vaginal bleeding again that she describes as dark, and passing large clots.  Patient reports she had intended to voluntarily terminate the pregnancy, and is scheduled to see clinic provider in about 10 days.  Past Medical History:  Diagnosis Date  . Labor and delivery indication for care or intervention 07/24/2018  . Postpartum care following vaginal delivery 01/30/2017  . Trichomoniasis 07/15/2016    There are no problems to display for this patient.  History reviewed. No pertinent surgical history.  Prior to Admission medications   Medication Sig Start Date End Date Taking? Authorizing Provider  erythromycin ophthalmic ointment Place 1 application into the right eye 4 (four) times daily. 02/14/20   Alyssabeth Bruster, Dannielle Karvonen, PA-C  ketorolac (ACULAR) 0.5 % ophthalmic solution Place 1 drop into the right eye 4 (four) times daily as needed for up to 7 days. 02/14/20 02/21/20  Bernyce Brimley, Dannielle Karvonen, PA-C  norethindrone-ethinyl estradiol (JUNEL FE,GILDESS FE,LOESTRIN FE) 1-20 MG-MCG tablet Take 1 tablet by mouth daily. 07/26/18 07/26/19  Ward, Honor Loh, MD     Allergies Patient has no known allergies.  No family history on file.  Social History Social History   Tobacco Use  . Smoking status: Current Some Day Smoker    Types: Cigarettes  . Smokeless tobacco: Never Used  Substance Use Topics  . Alcohol use: Yes  . Drug use: No    Review of Systems  Constitutional: Negative for fever. Eyes: Negative for visual changes. Reports eye irritation and tearing.  ENT: Negative for sore throat. Respiratory: Negative for shortness of breath. Gastrointestinal: Negative for abdominal pain, vomiting and diarrhea. Genitourinary: Negative for dysuria. Reports vaginal bleeding Musculoskeletal: Negative for back pain. ____________________________________________  PHYSICAL EXAM:  VITAL SIGNS: ED Triage Vitals  Enc Vitals Group     BP 02/14/20 1355 (!) 143/92     Pulse Rate 02/14/20 1355 73     Resp 02/14/20 1355 20     Temp 02/14/20 1359 98.1 F (36.7 C)     Temp Source 02/14/20 1359 Oral     SpO2 02/14/20 1355 100 %     Weight 02/14/20 1356 173 lb (78.5 kg)     Height 02/14/20 1356 5\' 3"  (1.6 m)     Head Circumference --      Peak Flow --      Pain Score 02/14/20 1356 10     Pain Loc --      Pain Edu? --      Excl. in [place]? --     Constitutional: Alert and oriented. Well appearing and in no distress. Head: Normocephalic and atraumatic. Eyes: Conjunctivae are injected on the right. Tearing noted  on the right. No crusting, matting, or purulence appreciated. No gross foreign body on exam. No FB with lid eversion. No fluorescein dye uptake appreciated. PERRL. Normal extraocular movements Hematological/Lymphatic/Immunological: No preauricular lymphadenopathy. Cardiovascular: Normal rate, regular rhythm. Normal distal pulses. Respiratory: Normal respiratory effort. No wheezes/rales/rhonchi. GU: deferred Skin:  Skin is warm, dry and intact. No rash noted. Psychiatric: Mood and affect are normal. Patient exhibits appropriate insight and  judgment. ____________________________________________   LABS (pertinent positives/negatives) Labs Reviewed  HCG, QUANTITATIVE, PREGNANCY - Abnormal; Notable for the following components:      Result Value   hCG, Beta Chain, Quant, S 141 (*)    All other components within normal limits  CBC - Abnormal; Notable for the following components:   RBC 3.83 (*)    Hemoglobin 11.8 (*)    HCT 34.7 (*)    All other components within normal limits  POCT PREGNANCY, URINE - Abnormal; Notable for the following components:   Preg Test, Ur POSITIVE (*)    All other components within normal limits  POC URINE PREG, ED  ____________________________________________   RADIOLOGY  OB w/ Transvaginal <14 weeks   IMPRESSION: 1. No IUP identified. Findings consistent with pregnancy of unknown location, differential of which includes IUP too early to visualize, recent failed pregnancy, or occult ectopic. Recommend trending of HCG with repeat ultrasound as indicated 2. Small free fluid ____________________________________________  PROCEDURES  Visual Acuity  Right Eye Distance:  20/40 Left Eye Distance:  20/40 Bilateral Distance:    Erythromycin ophthalmic ointment - OD  Procedures ____________________________________________  INITIAL IMPRESSION / ASSESSMENT AND PLAN / ED COURSE  Patient with ED evaluation of 2 separate complaints. She is evaluated for right eye irritation, without signs of a retained foreign body or corneal abrasion. She will be treated empirically for conjunctivitis and corneal injury with erythromycin ointment and ketorolac ophthalmic. Secondarily, she is evaluated in the context of abnormal vaginal bleeding following a recent positive pregnancy test. Her ultrasound does not reveal an IUP. This may indicate an early/unidentified IUP, failed pregnancy, or occult ectopic. She will return to the ED in 48-72 hours for repeat hcg. She understands the indications of downward trending  hcg and expectant miscarriage. Return precautions have been reviewed.   Jamie Gentry was evaluated in Emergency Department on 02/15/2020 for the symptoms described in the history of present illness. She was evaluated in the context of the global COVID-19 pandemic, which necessitated consideration that the patient might be at risk for infection with the SARS-CoV-2 virus that causes COVID-19. Institutional protocols and algorithms that pertain to the evaluation of patients at risk for COVID-19 are in a state of rapid change based on information released by regulatory bodies including the CDC and federal and state organizations. These policies and algorithms were followed during the patient's care in the ED. ____________________________________________  FINAL CLINICAL IMPRESSION(S) / ED DIAGNOSES  Final diagnoses:  Acute conjunctivitis of right eye, unspecified acute conjunctivitis type  Vaginal bleeding  Positive blood pregnancy test      Lissa Hoard, PA-C 02/15/20 1530    Willy Eddy, MD 02/16/20 1505

## 2020-03-27 ENCOUNTER — Ambulatory Visit: Payer: Medicaid Other

## 2020-06-23 ENCOUNTER — Ambulatory Visit: Payer: Medicaid Other

## 2020-10-03 ENCOUNTER — Emergency Department
Admission: EM | Admit: 2020-10-03 | Discharge: 2020-10-03 | Disposition: A | Discharge status: Home / Self Care | Payer: Medicaid Other | Attending: Student in an Organized Health Care Education/Training Program | Admitting: Student in an Organized Health Care Education/Training Program

## 2020-10-03 ENCOUNTER — Other Ambulatory Visit: Payer: Self-pay

## 2020-10-03 ENCOUNTER — Emergency Department: Payer: Medicaid Other

## 2020-10-03 DIAGNOSIS — F17210 Nicotine dependence, cigarettes, uncomplicated: Secondary | ICD-10-CM | POA: Insufficient documentation

## 2020-10-03 DIAGNOSIS — J101 Influenza due to other identified influenza virus with other respiratory manifestations: Secondary | ICD-10-CM | POA: Diagnosis not present

## 2020-10-03 DIAGNOSIS — Z20822 Contact with and (suspected) exposure to covid-19: Secondary | ICD-10-CM | POA: Insufficient documentation

## 2020-10-03 DIAGNOSIS — R0981 Nasal congestion: Secondary | ICD-10-CM | POA: Diagnosis present

## 2020-10-03 DIAGNOSIS — J111 Influenza due to unidentified influenza virus with other respiratory manifestations: Secondary | ICD-10-CM

## 2020-10-03 LAB — URINALYSIS, COMPLETE (UACMP) WITH MICROSCOPIC
Bilirubin Urine: NEGATIVE
Glucose, UA: NEGATIVE mg/dL
Hgb urine dipstick: NEGATIVE
Ketones, ur: NEGATIVE mg/dL
Nitrite: NEGATIVE
Protein, ur: NEGATIVE mg/dL
Specific Gravity, Urine: 1.025 (ref 1.005–1.030)
pH: 7 (ref 5.0–8.0)

## 2020-10-03 LAB — RESP PANEL BY RT-PCR (FLU A&B, COVID) ARPGX2
Influenza A by PCR: POSITIVE — AB
Influenza B by PCR: NEGATIVE
SARS Coronavirus 2 by RT PCR: NEGATIVE

## 2020-10-03 LAB — POC URINE PREG, ED: Preg Test, Ur: NEGATIVE

## 2020-10-03 MED ORDER — ONDANSETRON 4 MG PO TBDP: 4.0000 mg | ORAL_TABLET | Freq: Three times a day (TID) | ORAL | 0 refills | Status: DC | PRN

## 2020-10-03 MED ORDER — BENZONATATE 100 MG PO CAPS: ORAL_CAPSULE | ORAL | 0 refills | Status: DC

## 2020-10-03 MED ORDER — ACETAMINOPHEN 325 MG PO TABS
650.0000 mg | ORAL_TABLET | Freq: Once | ORAL | Status: AC
  Administered 2020-10-03: 650 mg via ORAL
  Filled 2020-10-03: qty 2

## 2020-10-03 NOTE — Discharge Instructions (Signed)
Your exam and viral test confirm influenza (flu).  Take prescription medications as directed.  Take over-the-counter Tylenol or Motrin for additional body ache and fever relief.  Continue with your DayQuil/NyQuil regimen.  Follow with your provider or return to the ED if needed.

## 2020-10-03 NOTE — ED Provider Notes (Signed)
[facility] Emergency Department Provider Note ____________________________________________  Time seen: 1209  I have reviewed the triage vital signs and the nursing notes.  HISTORY  Chief Complaint  URI  HPI Jamie Gentry is a 24 y.o. female presents herself to the ED for evaluation of 3-day complaint of  generalized body aches, congestion, loss of appetite, loss of taste.  Patient is also had exacerbation of her baseline headache over the last few days.  She has been taking over-the-counter DayQuil and NyQuil for symptom relief medication.  She has received her Covid vaccine, but is not been vaccinated against influenza.  She denies any sick contacts, recent travel, or other high risk exposures.  Past Medical History:  Diagnosis Date   Labor and delivery indication for care or intervention 07/24/2018   Postpartum care following vaginal delivery 01/30/2017   Trichomoniasis 07/15/2016    There are no problems to display for this patient.   History reviewed. No pertinent surgical history.  Prior to Admission medications   Medication Sig Start Date End Date Taking? Authorizing Provider  benzonatate (TESSALON PERLES) 100 MG capsule Take 1-2 tabs TID prn cough 10/03/20   Kanita Delage, Charlesetta Ivory, PA-C  norethindrone-ethinyl estradiol (JUNEL FE,GILDESS FE,LOESTRIN FE) 1-20 MG-MCG tablet Take 1 tablet by mouth daily. 07/26/18 07/26/19  Ward, Elenora Fender, MD  ondansetron (ZOFRAN ODT) 4 MG disintegrating tablet Take 1 tablet (4 mg total) by mouth every 8 (eight) hours as needed. 10/03/20   Sayward Horvath, Charlesetta Ivory, PA-C    Allergies Patient has no known allergies.  History reviewed. No pertinent family history.  Social History Social History   Tobacco Use   Smoking status: Current Some Day Smoker    Types: Cigarettes   Smokeless tobacco: Never Used  Building services engineer Use: Never used  Substance Use Topics   Alcohol use: Yes   Drug use: No     Review of Systems  Constitutional: Positive for fever. Eyes: Negative for visual changes. ENT: Negative for sore throat.  Reports sinus congestion. Cardiovascular: Negative for chest pain. Respiratory: Negative for shortness of breath. Gastrointestinal: Negative for abdominal pain, vomiting and diarrhea. Genitourinary: Negative for dysuria. Musculoskeletal: Negative for back pain.  Reports generalized body aches Skin: Negative for rash. Neurological: Negative for headaches, focal weakness or numbness. ____________________________________________  PHYSICAL EXAM:  VITAL SIGNS: ED Triage Vitals  Enc Vitals Group     BP 10/03/20 1023 (!) 103/58     Pulse Rate 10/03/20 1023 99     Resp 10/03/20 1023 19     Temp 10/03/20 1023 99.9 F (37.7 C)     Temp Source 10/03/20 1023 Oral     SpO2 10/03/20 1023 98 %     Weight --      Height --      Head Circumference --      Peak Flow --      Pain Score 10/03/20 1430 10     Pain Loc --      Pain Edu? --      Excl. in [place]? --     Constitutional: Alert and oriented. Well appearing and in no distress. Head: Normocephalic and atraumatic. Eyes: Conjunctivae are normal. Normal extraocular movements Cardiovascular: Normal rate, regular rhythm. Normal distal pulses. Respiratory: Normal respiratory effort.  Musculoskeletal: Nontender with normal range of motion in all extremities.  Neurologic:  Normal gait without ataxia. Normal speech and language. No gross focal neurologic deficits are appreciated. Skin:  Skin is warm,  dry and intact. No rash noted. Psychiatric: Mood and affect are normal. Patient exhibits appropriate insight and judgment. ____________________________________________   LABS (pertinent positives/negatives) Labs Reviewed  RESP PANEL BY RT-PCR (FLU A&B, COVID) ARPGX2 - Abnormal; Notable for the following components:      Result Value   Influenza A by PCR POSITIVE (*)    All other components within normal limits   URINALYSIS, COMPLETE (UACMP) WITH MICROSCOPIC - Abnormal; Notable for the following components:   Color, Urine YELLOW (*)    APPearance CLOUDY (*)    Leukocytes,Ua TRACE (*)    Bacteria, UA FEW (*)    All other components within normal limits  POC URINE PREG, ED  ____________________________________________   RADIOLOGY  CXR  Negative ____________________________________________  PROCEDURES  Acetaminophen 650 mg p.o.  Procedures ____________________________________________  INITIAL IMPRESSION / ASSESSMENT AND PLAN / ED COURSE  Differential includes, but is not limited to, viral syndrome, bronchitis including COPD exacerbation, pneumonia, reactive airway disease including asthma, CHF including exacerbation with or without pulmonary/interstitial edema, pneumothorax, ACS, thoracic trauma, and pulmonary embolism.  Patient with ED evaluation of 3-day complaint of muscle aches, myalgias, sinus congestion, and fevers.  She was screened for symptoms and found to have a positive viral screen for influenza A.  Her chest x-ray is negative and reassuring for any acute intrathoracic process.  Patient is reassured by the self-limited course of a viral illness.  She will continue with over-the-counter medicines as well as prescription nausea medicine and cough medicine.  She will follow-up with her primary provider or return to the ED if needed.  Work note is provided for 3 days as is appropriate.  JAMIE-LEE GALDAMEZ was evaluated in Emergency Department on 10/03/2020 for the symptoms described in the history of present illness. She was evaluated in the context of the global COVID-19 pandemic, which necessitated consideration that the patient might be at risk for infection with the SARS-CoV-2 virus that causes COVID-19. Institutional protocols and algorithms that pertain to the evaluation of patients at risk for COVID-19 are in a state of rapid change based on information released by regulatory bodies  including the CDC and federal and state organizations. These policies and algorithms were followed during the patient's care in the ED. ____________________________________________  FINAL CLINICAL IMPRESSION(S) / ED DIAGNOSES  Final diagnoses:  Influenza      Lissa Hoard, PA-C 10/03/20 1522    Willy Eddy, MD 10/03/20 1524

## 2020-10-03 NOTE — ED Triage Notes (Signed)
Pt comes into the ED via EMS from home with body aches, congestion with loss of appetite and taste, HA for the past 3 days 99.5, 98HR, 145/99, CBG 164

## 2020-10-03 NOTE — ED Notes (Signed)
Pt c/o body aches, possible fever, nausea, HA, cough, runny nose x 3 days, pt states has not been tested for covid at this time but needs a covid test. PT A&O x4, NAD noted at this time. Pt able to speak in full and complete sentences without difficulty at this time.

## 2020-10-03 NOTE — ED Notes (Signed)
Pt up to restroom to provide urine sample. Steady.

## 2021-04-16 ENCOUNTER — Ambulatory Visit: Payer: Medicaid Other

## 2021-04-20 ENCOUNTER — Ambulatory Visit: Payer: Medicaid Other

## 2021-04-23 ENCOUNTER — Other Ambulatory Visit: Payer: Self-pay

## 2021-04-23 ENCOUNTER — Ambulatory Visit: Payer: Medicaid Other | Admitting: Physician Assistant

## 2021-04-23 DIAGNOSIS — Z113 Encounter for screening for infections with a predominantly sexual mode of transmission: Secondary | ICD-10-CM

## 2021-04-23 DIAGNOSIS — A5901 Trichomonal vulvovaginitis: Secondary | ICD-10-CM

## 2021-04-23 LAB — WET PREP FOR TRICH, YEAST, CLUE
Trichomonas Exam: POSITIVE — AB
Yeast Exam: NEGATIVE

## 2021-04-23 MED ORDER — METRONIDAZOLE 500 MG PO TABS: 500.0000 mg | ORAL_TABLET | Freq: Two times a day (BID) | ORAL | 0 refills | Status: AC

## 2021-04-26 ENCOUNTER — Encounter: Payer: Self-pay | Admitting: Physician Assistant

## 2021-04-26 NOTE — Progress Notes (Signed)
[facility] Department STI clinic/screening visit  Subjective:  Jamie Gentry is a 25 y.o. female being seen today for an STI screening visit. The patient reports they do not have symptoms.  Patient reports that they do not desire a pregnancy in the next year.   They reported they are not interested in discussing contraception today.  No LMP recorded.   Patient has the following medical conditions:  There are no problems to display for this patient.   Chief Complaint  Patient presents with   SEXUALLY TRANSMITTED DISEASE    screening    HPI  Patient reports that she is not having any symptoms but would like a screening today.  Denies chronic conditions, surgeries and regular medicines.  LMP was 04/04/2021 and using condoms as BCM.  States last HIV test was in 2021 and last pap was about 3 years ago.   See flowsheet for further details and programmatic requirements.    The following portions of the patient's history were reviewed and updated as appropriate: allergies, current medications, past medical history, past social history, past surgical history and problem list.  Objective:  There were no vitals filed for this visit.  Physical Exam Constitutional:      General: She is not in acute distress.    Appearance: Normal appearance.  HENT:     Head: Normocephalic and atraumatic.     Comments: No nits,lice, or hair loss. No cervical, supraclavicular or axillary adenopathy.     Mouth/Throat:     Mouth: Mucous membranes are moist.     Pharynx: Oropharynx is clear. No oropharyngeal exudate or posterior oropharyngeal erythema.  Eyes:     Conjunctiva/sclera: Conjunctivae normal.  Pulmonary:     Effort: Pulmonary effort is normal.  Abdominal:     Palpations: Abdomen is soft. There is no mass.     Tenderness: There is no abdominal tenderness. There is no guarding or rebound.  Genitourinary:    General: Normal vulva.     Rectum: Normal.     Comments: External  genitalia/pubic area without nits, lice, edema, erythema, lesions and inguinal adenopathy. Vagina with normal mucosa and small amount of white discharge. Cervix without visible lesions. Uterus firm, mobile, nt, no masses, no CMT, no adnexal tenderness or fullness.  Musculoskeletal:     Cervical back: Neck supple. No tenderness.  Skin:    General: Skin is warm and dry.     Findings: No bruising, erythema, lesion or rash.  Neurological:     Mental Status: She is alert and oriented to person, place, and time.  Psychiatric:        Mood and Affect: Mood normal.        Behavior: Behavior normal.        Thought Content: Thought content normal.        Judgment: Judgment normal.     Assessment and Plan:  ASTIN RAPE is a 25 y.o. female presenting to the [facility] Department for STI screening  1. Screening for STD (sexually transmitted disease) Patient into clinic without symptoms. Reviewed with patient wet mount results and need for treatment today. Rec condoms with all sex. Await test results.  Counseled that RN will call if needs to RTC for treatment once results are back.  - WET PREP FOR TRICH, YEAST, CLUE - Gonococcus culture - Chlamydia/Gonorrhea [place] [facility] - HIV [place] [facility] - Syphilis Serology, [place] [facility]  2. Trichomonal vulvovaginitis Treat Trich and BV with Metronidazole 500  mg #14 1 po BID for 7 days with food, no EtOH for 24 hr before and until 72 hr after completing medicine. No sex for 14 days and until after partner/s complete treatment. Enc to use OTC antifungal cream if has itching during or just after antibiotic use.  - metroNIDAZOLE (FLAGYL) 500 MG tablet; Take 1 tablet (500 mg total) by mouth 2 (two) times daily for 7 days.  Dispense: 14 tablet; Refill: 0     No follow-ups on file.  No future appointments.  Matt Holmes, PA

## 2021-04-28 LAB — HM HIV SCREENING LAB: HM HIV Screening: NEGATIVE

## 2021-04-28 LAB — GONOCOCCUS CULTURE

## 2021-05-02 NOTE — Progress Notes (Signed)
Chart reviewed by Pharmacist  Suzanne Walker PharmD, Contract Pharmacist at [facility]  

## 2021-05-18 ENCOUNTER — Ambulatory Visit: Payer: Medicaid Other | Admitting: Physician Assistant

## 2021-05-18 ENCOUNTER — Other Ambulatory Visit: Payer: Self-pay

## 2021-05-18 DIAGNOSIS — Z113 Encounter for screening for infections with a predominantly sexual mode of transmission: Secondary | ICD-10-CM

## 2021-05-18 DIAGNOSIS — B373 Candidiasis of vulva and vagina: Secondary | ICD-10-CM

## 2021-05-18 DIAGNOSIS — B3731 Acute candidiasis of vulva and vagina: Secondary | ICD-10-CM

## 2021-05-18 LAB — WET PREP FOR TRICH, YEAST, CLUE: Trichomonas Exam: NEGATIVE

## 2021-05-18 MED ORDER — CLOTRIMAZOLE 1 % VA CREA: 1.0000 | TOPICAL_CREAM | Freq: Every day | VAGINAL | 0 refills | Status: AC

## 2021-05-19 ENCOUNTER — Encounter: Payer: Self-pay | Admitting: Physician Assistant

## 2021-05-19 NOTE — Progress Notes (Signed)
  [facility] Department STI clinic/screening visit  Subjective:  Jamie Gentry is a 25 y.o. female being seen today for an STI screening visit. The patient reports they do not have symptoms.  Patient reports that they do not desire a pregnancy in the next year.   They reported they are not interested in discussing contraception today.  No LMP recorded.   Patient has the following medical conditions:  There are no problems to display for this patient.   Chief Complaint  Patient presents with   SEXUALLY TRANSMITTED DISEASE    screening    HPI  Patient reports that she is not having any symptoms but would like a screening today. Denies chronic conditions, surgeries and regular medicines.  LMP was 04/30/2021 and using condoms as BCM.  States last HIV test was earlier this year and last pap was about 3 years ago.   See flowsheet for further details and programmatic requirements.    The following portions of the patient's history were reviewed and updated as appropriate: allergies, current medications, past medical history, past social history, past surgical history and problem list.  Objective:  There were no vitals filed for this visit.  Physical Exam Constitutional:      General: She is not in acute distress.    Appearance: Normal appearance.  HENT:     Head: Normocephalic and atraumatic.     Comments: No nits,lice, or hair loss. No cervical, supraclavicular or axillary adenopathy.  Eyes:     Conjunctiva/sclera: Conjunctivae normal.  Pulmonary:     Effort: Pulmonary effort is normal.  Musculoskeletal:     Cervical back: Neck supple. No tenderness.  Skin:    General: Skin is warm and dry.     Findings: No bruising, erythema, lesion or rash.  Neurological:     Mental Status: She is alert and oriented to person, place, and time.  Psychiatric:        Mood and Affect: Mood normal.        Behavior: Behavior normal.        Thought Content: Thought content normal.         Judgment: Judgment normal.     Assessment and Plan:  Jamie Gentry is a 25 y.o. female presenting to the [facility] Department for STI screening  1. Screening for STD (sexually transmitted disease) Patient into clinic without symptoms. Patient declines pelvic by provider and desires to self collect vaginal samples. Reviewed wet mount results.  Rec condoms with all sex. Await test results.  Counseled that RN will call if needs to RTC for treatment once results are back.  - WET PREP FOR TRICH, YEAST, CLUE - Chlamydia/Gonorrhea [place] [facility] - HIV [place] [facility] - Syphilis Serology, [place] [facility]  2. Vaginal candidiasis Will treat yeast with Clotrimazole 1% vaginal cream 1 app qhs for 7 days. - clotrimazole (CLOTRIMAZOLE-7) 1 % vaginal cream; Place 1 Applicatorful vaginally at bedtime for 7 days.  Dispense: 45 g; Refill: 0     No follow-ups on file.  No future appointments.  Matt Holmes, PA

## 2021-05-21 LAB — HM HIV SCREENING LAB: HM HIV Screening: NEGATIVE

## 2021-06-01 ENCOUNTER — Emergency Department
Admission: EM | Admit: 2021-06-01 | Discharge: 2021-06-01 | Disposition: A | Discharge status: Home / Self Care | Payer: Medicaid Other | Attending: Emergency Medicine | Admitting: Emergency Medicine

## 2021-06-01 ENCOUNTER — Emergency Department: Payer: Medicaid Other

## 2021-06-01 ENCOUNTER — Encounter: Payer: Self-pay | Admitting: Emergency Medicine

## 2021-06-01 DIAGNOSIS — F17210 Nicotine dependence, cigarettes, uncomplicated: Secondary | ICD-10-CM | POA: Diagnosis not present

## 2021-06-01 DIAGNOSIS — M25531 Pain in right wrist: Secondary | ICD-10-CM | POA: Diagnosis not present

## 2021-06-01 DIAGNOSIS — Y92410 Unspecified street and highway as the place of occurrence of the external cause: Secondary | ICD-10-CM | POA: Insufficient documentation

## 2021-06-01 DIAGNOSIS — M79641 Pain in right hand: Secondary | ICD-10-CM | POA: Diagnosis not present

## 2021-06-01 DIAGNOSIS — R1030 Lower abdominal pain, unspecified: Secondary | ICD-10-CM | POA: Diagnosis not present

## 2021-06-01 DIAGNOSIS — S0990XA Unspecified injury of head, initial encounter: Secondary | ICD-10-CM

## 2021-06-01 MED ORDER — IBUPROFEN 800 MG PO TABS: 800.0000 mg | ORAL_TABLET | Freq: Three times a day (TID) | ORAL | 0 refills | Status: DC | PRN

## 2021-06-01 MED ORDER — IBUPROFEN 800 MG PO TABS
800.0000 mg | ORAL_TABLET | Freq: Once | ORAL | Status: DC
  Filled 2021-06-01: qty 1
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
## 2021-06-01 NOTE — Discharge Instructions (Addendum)
You may alternate Tylenol 1000 mg every 6 hours as needed for pain, fever and Ibuprofen 800 mg every 8 hours as needed for pain, fever.  Please take Ibuprofen with food.  Do not take more than 4000 mg of Tylenol (acetaminophen) in a 24 hour period.  CT of your head and cervical spine today were normal.  X-rays of your right hand and wrist did not show any fracture or dislocation.
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
## 2021-06-10 ENCOUNTER — Other Ambulatory Visit: Payer: Self-pay

## 2021-06-10 ENCOUNTER — Ambulatory Visit: Payer: Medicaid Other | Admitting: Physician Assistant

## 2021-06-10 DIAGNOSIS — Z113 Encounter for screening for infections with a predominantly sexual mode of transmission: Secondary | ICD-10-CM | POA: Diagnosis not present

## 2021-06-10 DIAGNOSIS — Z3009 Encounter for other general counseling and advice on contraception: Secondary | ICD-10-CM

## 2021-06-10 LAB — WET PREP FOR TRICH, YEAST, CLUE
Trichomonas Exam: NEGATIVE
Yeast Exam: NEGATIVE

## 2021-06-10 NOTE — Progress Notes (Signed)
Pt here for STD screening.  Wet mount results reviewed, no treatment required.  Pt declined condoms. Taronda Comacho M Almas Rake, RN  

## 2021-06-11 ENCOUNTER — Encounter: Payer: Self-pay | Admitting: Physician Assistant

## 2021-06-11 NOTE — Progress Notes (Signed)
[facility] Department STI clinic/screening visit  Subjective:  KELLEY POLINSKY is a 25 y.o. female being seen today for an STI screening visit. The patient reports they do not have symptoms.  Patient reports that they do not desire a pregnancy in the next year.   They reported they are interested in discussing contraception today.  Patient's last menstrual period was 06/04/2021.   Patient has the following medical conditions:  There are no problems to display for this patient.   Chief Complaint  Patient presents with   SEXUALLY TRANSMITTED DISEASE    Screening    HPI  Patient reports that she is not having any symptoms but wants a screening today.  Denies chronic conditions, surgeries and regular medicines.  LMP was 05/28/2021 and using condoms as BCM.  States last HIV test was 3 weeks ago at her last visit and last pap was 3 years ago.    See flowsheet for further details and programmatic requirements.    The following portions of the patient's history were reviewed and updated as appropriate: allergies, current medications, past medical history, past social history, past surgical history and problem list.  Objective:  There were no vitals filed for this visit.  Physical Exam Constitutional:      General: She is not in acute distress.    Appearance: Normal appearance.  HENT:     Head: Normocephalic and atraumatic.     Comments: No nits,lice, or hair loss. No cervical, supraclavicular or axillary adenopathy.     Mouth/Throat:     Mouth: Mucous membranes are moist.     Pharynx: Oropharynx is clear. No oropharyngeal exudate or posterior oropharyngeal erythema.  Eyes:     Conjunctiva/sclera: Conjunctivae normal.  Pulmonary:     Effort: Pulmonary effort is normal.  Abdominal:     Palpations: Abdomen is soft. There is no mass.     Tenderness: There is no abdominal tenderness. There is no guarding or rebound.  Genitourinary:    General: Normal vulva.      Rectum: Normal.     Comments: External genitalia/pubic area without nits, lice, edema, erythema, lesions and inguinal adenopathy. Vagina with normal mucosa and discharge. Cervix without visible lesions. Uterus firm, mobile, nt, no masses, no CMT, no adnexal tenderness or fullness.  Musculoskeletal:     Cervical back: Neck supple. No tenderness.  Skin:    General: Skin is warm and dry.     Findings: No bruising, erythema, lesion or rash.  Neurological:     Mental Status: She is alert and oriented to person, place, and time.  Psychiatric:        Mood and Affect: Mood normal.        Behavior: Behavior normal.        Thought Content: Thought content normal.        Judgment: Judgment normal.     Assessment and Plan:  JADELYN ELKS is a 25 y.o. female presenting to the [facility] Department for STI screening  1. Screening for STD (sexually transmitted disease) Patient into clinic without symptoms. Rec condoms with all sex. Await test results.  Counseled that RN will call if needs to RTC for treatment once results are back.  - WET PREP FOR TRICH, YEAST, CLUE - Chlamydia/Gonorrhea [place] [facility] - HIV [place] [facility] - Syphilis Serology, [place] [place] [facility]  2. Encounter for counseling regarding contraception Patient counseled about BCM options but is not sure which she wants to use at this  time. [facility] pamphlet given to patient and enc to review and schedule for [facility] visit to discuss options further.   No follow-ups on file.  No future appointments.  Matt Holmes, PA

## 2021-07-15 ENCOUNTER — Ambulatory Visit: Payer: Medicaid Other

## 2021-07-23 ENCOUNTER — Emergency Department: Payer: Medicaid Other

## 2021-07-23 ENCOUNTER — Encounter: Payer: Self-pay | Admitting: Emergency Medicine

## 2021-07-23 ENCOUNTER — Other Ambulatory Visit: Payer: Self-pay

## 2021-07-23 ENCOUNTER — Emergency Department
Admission: EM | Admit: 2021-07-23 | Discharge: 2021-07-23 | Disposition: A | Discharge status: Home / Self Care | Payer: Medicaid Other | Attending: Emergency Medicine | Admitting: Emergency Medicine

## 2021-07-23 DIAGNOSIS — S8991XA Unspecified injury of right lower leg, initial encounter: Secondary | ICD-10-CM | POA: Diagnosis present

## 2021-07-23 DIAGNOSIS — M7918 Myalgia, other site: Secondary | ICD-10-CM | POA: Insufficient documentation

## 2021-07-23 DIAGNOSIS — F17210 Nicotine dependence, cigarettes, uncomplicated: Secondary | ICD-10-CM | POA: Diagnosis not present

## 2021-07-23 DIAGNOSIS — Y92410 Unspecified street and highway as the place of occurrence of the external cause: Secondary | ICD-10-CM | POA: Diagnosis not present

## 2021-07-23 DIAGNOSIS — S8011XA Contusion of right lower leg, initial encounter: Secondary | ICD-10-CM | POA: Insufficient documentation

## 2021-07-23 LAB — POC URINE PREG, ED: Preg Test, Ur: NEGATIVE

## 2021-07-23 MED ORDER — IBUPROFEN 800 MG PO TABS
800.0000 mg | ORAL_TABLET | Freq: Once | ORAL | Status: AC
  Administered 2021-07-23: 800 mg via ORAL
  Filled 2021-07-23: qty 1

## 2021-07-23 MED ORDER — LIDOCAINE 4 % EX PTCH: 1.0000 | MEDICATED_PATCH | Freq: Two times a day (BID) | CUTANEOUS | 0 refills | Status: AC | PRN

## 2021-07-23 MED ORDER — CYCLOBENZAPRINE HCL 5 MG PO TABS: 5.0000 mg | ORAL_TABLET | Freq: Three times a day (TID) | ORAL | 0 refills | Status: AC | PRN

## 2021-07-23 MED ORDER — IBUPROFEN 800 MG PO TABS: 800.0000 mg | ORAL_TABLET | Freq: Three times a day (TID) | ORAL | 0 refills | Status: AC | PRN

## 2021-07-23 MED ORDER — LIDOCAINE 5 % EX PTCH
1.0000 | MEDICATED_PATCH | Freq: Once | CUTANEOUS | Status: DC
  Administered 2021-07-23: 1 via TRANSDERMAL
  Filled 2021-07-23: qty 1

## 2021-07-23 MED ORDER — CYCLOBENZAPRINE HCL 10 MG PO TABS
10.0000 mg | ORAL_TABLET | Freq: Once | ORAL | Status: AC
  Administered 2021-07-23: 10 mg via ORAL
  Filled 2021-07-23: qty 1

## 2021-07-23 NOTE — Discharge Instructions (Signed)
Your exam and x-rays are all normal and reassuring at this time.  No signs of any acute fracture or dislocation.  You can expect to be sore and stiff for the next several days.  Take prescription medication as provided.  Follow-up with your primary provider or return to ED if needed.

## 2021-07-23 NOTE — ED Notes (Signed)
Patient stable and discharged with all personal belongings and AVS. AVS and discharge instructions reviewed with patient and opportunity for questions provided.   
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
## 2021-09-15 ENCOUNTER — Ambulatory Visit: Payer: Medicaid Other

## 2021-09-21 ENCOUNTER — Ambulatory Visit: Payer: Medicaid Other

## 2022-01-13 ENCOUNTER — Ambulatory Visit: Payer: Medicaid Other

## 2022-01-19 ENCOUNTER — Encounter: Payer: Self-pay | Admitting: Family Medicine

## 2022-01-19 ENCOUNTER — Ambulatory Visit: Payer: Medicaid Other | Admitting: Family Medicine

## 2022-01-19 DIAGNOSIS — Z113 Encounter for screening for infections with a predominantly sexual mode of transmission: Secondary | ICD-10-CM | POA: Diagnosis not present

## 2022-01-19 DIAGNOSIS — A599 Trichomoniasis, unspecified: Secondary | ICD-10-CM

## 2022-01-19 LAB — WET PREP FOR TRICH, YEAST, CLUE
Trichomonas Exam: POSITIVE — AB
Yeast Exam: NEGATIVE

## 2022-01-19 LAB — HM HIV SCREENING LAB: HM HIV Screening: NEGATIVE

## 2022-01-19 MED ORDER — METRONIDAZOLE 500 MG PO TABS: 500.0000 mg | ORAL_TABLET | Freq: Two times a day (BID) | ORAL | 0 refills | Status: AC

## 2022-01-19 NOTE — Progress Notes (Signed)
[facility] Department ? ?STI clinic/screening visit ?[place] [address] ?[phone] ? ?Subjective:  ?RECHEL KAPSNER is a 26 y.o. female being seen today for an STI screening visit. The patient reports they do not have symptoms.  Patient reports that they do not desire a pregnancy in the next year.   They reported they are not interested in discussing contraception today.   ? ?Patient's last menstrual period was 01/04/2022 (approximate). ? ? ?Patient has the following medical conditions:  There are no problems to display for this patient. ? ? ?Chief Complaint  ?Patient presents with  ? SEXUALLY TRANSMITTED DISEASE  ?  Screening  ? ? ?HPI ? ?Patient reports here for screening, denies s/sx  ? ?Last HIV test per patient/review of record was 05/2021 ?Patient reports last pap was done here, no previous paps on record here at [facility].  ? ?Screening for MPX risk: ?Does the patient have an unexplained rash? No ?Is the patient MSM? No ?Does the patient endorse multiple sex partners or anonymous sex partners? No ?Did the patient have close or sexual contact with a person diagnosed with MPX? No ?Has the patient traveled outside the [place] where MPX is endemic? No ?Is there a high clinical suspicion for MPX-- evidenced by one of the following No ? -Unlikely to be chickenpox ? -Lymphadenopathy ? -Rash that present in same phase of evolution on any given body part ?See flowsheet for further details and programmatic requirements.  ? ? ?The following portions of the patient's history were reviewed and updated as appropriate: allergies, current medications, past medical history, past social history, past surgical history and problem list. ? ?Objective:  ?There were no vitals filed for this visit. ? ?Physical Exam ?Vitals and nursing note reviewed.  ?Constitutional:   ?   Appearance: Normal appearance.  ?HENT:  ?   Head: Normocephalic and atraumatic.  ?   Mouth/Throat:  ?   Mouth: Mucous membranes  are moist.  ?   Pharynx: Oropharynx is clear. No oropharyngeal exudate or posterior oropharyngeal erythema.  ?Pulmonary:  ?   Effort: Pulmonary effort is normal.  ?Abdominal:  ?   General: Abdomen is flat.  ?   Palpations: There is no mass.  ?   Tenderness: There is no abdominal tenderness. There is no rebound.  ?Genitourinary: ?   Exam position: Lithotomy position.  ?   Pubic Area: No rash or pubic lice.   ?   Labia:     ?   Right: No rash or lesion.     ?   Left: No rash or lesion.   ?   Vagina: No erythema, bleeding or lesions.  ?   Cervix: No cervical motion tenderness, discharge, friability, lesion or erythema.  ?   Uterus: Normal.   ?   Adnexa: Right adnexa normal and left adnexa normal.  ?   Comments: Deferred -pt self collected  ?Lymphadenopathy:  ?   Head:  ?   Right side of head: No preauricular or posterior auricular adenopathy.  ?   Left side of head: No preauricular or posterior auricular adenopathy.  ?   Cervical: No cervical adenopathy.  ?   Upper Body:  ?   Right upper body: No supraclavicular or axillary adenopathy.  ?   Left upper body: No supraclavicular or axillary adenopathy.  ?   Lower Body: No right inguinal adenopathy. No left inguinal adenopathy.  ?Skin: ?   General: Skin is warm and dry.  ?  Findings: No rash.  ?Neurological:  ?   Mental Status: She is alert and oriented to person, place, and time.  ?Psychiatric:     ?   Mood and Affect: Mood normal.     ?   Behavior: Behavior normal.  ? ? ? ?Assessment and Plan:  ?LOLITHA FRANA is a 26 y.o. female presenting to the [facility] Department for STI screening ? ?1. Screening examination for venereal disease ?Patient accepted all screenings including wet prep, oral, vaginal CT/GC and bloodwork for HIV/RPR.  ?Patient meets criteria for HepB screening? No. Ordered? No -   ?Patient meets criteria for HepC screening? Yes. Ordered? No - declined  ? ?Wet prep results trich    ?Treatment needed  ?Discussed time line for State Lab  results and that patient will be called with positive results and encouraged patient to call if she had not heard in 2 weeks.  ?Counseled to return or seek care for continued or worsening symptoms ?Recommended condom use with all sex ? ?Patient is currently using  no BCM   to prevent pregnancy.   ?- Chlamydia/Gonorrhea [place] [facility] ?- HIV [place] [facility] ?- WET PREP FOR TRICH, YEAST, CLUE ?- Syphilis Serology, [place] [facility] ? ?Return for as needed. ? ?No future appointments. ? ?Junious Dresser, FNP ?

## 2022-01-19 NOTE — Progress Notes (Signed)
Pt here for STD screening.  Wet mount results reviewed and medication dispensed, per Provider orders.  Pt declined condoms.  Nafisah Runions M Donatella Walski, RN ° °

## 2022-02-01 ENCOUNTER — Ambulatory Visit: Payer: Medicaid Other | Admitting: Family Medicine

## 2022-02-01 ENCOUNTER — Telehealth: Payer: Self-pay

## 2022-02-01 DIAGNOSIS — A749 Chlamydial infection, unspecified: Secondary | ICD-10-CM

## 2022-02-01 MED ORDER — DOXYCYCLINE HYCLATE 100 MG PO TABS: 100.0000 mg | ORAL_TABLET | Freq: Two times a day (BID) | ORAL | 0 refills | Status: AC

## 2022-02-01 NOTE — Telephone Encounter (Signed)
Phone call to pt and pt confirmed password from last visit.  Counseled pt regarding positive chlamydia result. Pt states she already has an appt with [facility] this afternoon (RN did confirm it is on schedule for today).  Pt counseled to eat before coming in. Pt expressed understanding. ?

## 2022-02-01 NOTE — Telephone Encounter (Signed)
Phone call to pt regarding positive chlamydia result form 01/19/22 vaginal specimen.  Pt needs tx appt. ? ? ?

## 2022-02-01 NOTE — Progress Notes (Signed)
Pt treated for Trich starting 4/11 - 4/18.  She also received a call today, regarding a positive Chlamydia result.  Pt would like to be retested for Trich and get treatment for Chlamydia.   ?Pt informed that it is to early to check her for Trich, but we could provide treatment for Chlamydia.   ?Medication dispensed per Provider orders.  Pt informed to return after 5/8, to be retested. Berdie Ogren, RN ? ?

## 2022-02-01 NOTE — Progress Notes (Signed)
This patient was not seen by a provider. In clinic too soon for [facility] and needs tx for Chlamydia.  See RN note.  ? ?Wendi Snipes, FNP  ? ?

## 2022-02-02 NOTE — Telephone Encounter (Signed)
Pt treated at 02/01/22 appt. ?

## 2022-02-17 ENCOUNTER — Ambulatory Visit: Payer: Medicaid Other

## 2022-03-09 ENCOUNTER — Ambulatory Visit: Payer: Medicaid Other

## 2022-03-15 ENCOUNTER — Encounter: Payer: Self-pay | Admitting: Advanced Practice Midwife

## 2022-03-15 ENCOUNTER — Ambulatory Visit: Payer: Medicaid Other | Admitting: Advanced Practice Midwife

## 2022-03-15 DIAGNOSIS — Z113 Encounter for screening for infections with a predominantly sexual mode of transmission: Secondary | ICD-10-CM | POA: Diagnosis not present

## 2022-03-15 LAB — HM HIV SCREENING LAB: HM HIV Screening: NEGATIVE

## 2022-03-15 LAB — WET PREP FOR TRICH, YEAST, CLUE
Trichomonas Exam: NEGATIVE
Yeast Exam: NEGATIVE

## 2022-03-15 NOTE — Progress Notes (Signed)
[facility] Department  STI clinic/screening visit [place] [id number] [phone]  Subjective:  Jamie Gentry is a 26 y.o. SBF smoker G4P3 female being seen today for an STI screening visit. The patient reports they do not have symptoms.  Patient reports that they do not desire a pregnancy in the next year.   They reported they are not interested in discussing contraception today.    Patient's last menstrual period was 03/01/2022 (approximate).   Patient has the following medical conditions:   Patient Active Problem List   Diagnosis Date Noted   Morbid obesity ([place]) 03/15/2022    Chief Complaint  Patient presents with   SEXUALLY TRANSMITTED DISEASE    Screening    HPI  Patient reports asymptomatic.  LMP 03/03/22. Last sex 03/05/22 with condom; with current partner x 2 years; 2 sex partner in last 3 mo. Last ETOH 03/12/22 (1 shot liquor) 2x/mo  Last HIV test per patient/review of record was 01/19/22 Patient reports last pap was pt can't remember  Screening for MPX risk: Does the patient have an unexplained rash? No Is the patient MSM? No Does the patient endorse multiple sex partners or anonymous sex partners? No Did the patient have close or sexual contact with a person diagnosed with MPX? No Has the patient traveled outside the [place] where MPX is endemic? No Is there a high clinical suspicion for MPX-- evidenced by one of the following No  -Unlikely to be chickenpox  -Lymphadenopathy  -Rash that present in same phase of evolution on any given body part See flowsheet for further details and programmatic requirements.   Immunization history:  Immunization History  Administered Date(s) Administered   Hepatitis B 06/02/1998, 07/16/1998, 02/17/2001   Tdap 12/04/2012     The following portions of the patient's history were reviewed and updated as appropriate: allergies, current medications, past medical history, past social history, past  surgical history and problem list.  Objective:  There were no vitals filed for this visit.  Physical Exam Constitutional:      Appearance: Normal appearance. She is obese.  HENT:     Head: Normocephalic and atraumatic.     Mouth/Throat:     Mouth: Mucous membranes are moist.  Eyes:     Conjunctiva/sclera: Conjunctivae normal.  Abdominal:     Palpations: Abdomen is soft.     Comments: Soft without masses or tenderness, fair tone  Genitourinary:    General: Normal vulva.     Vagina: Vaginal discharge (white creamy leukorrhea, ph<4.5) present.     Cervix: Normal.     Uterus: Normal.      Adnexa: Right adnexa normal and left adnexa normal.     Rectum: Normal.  Musculoskeletal:     Cervical back: Neck supple.  Lymphadenopathy:     Cervical:     Right cervical: No superficial, deep or posterior cervical adenopathy.    Left cervical: No superficial, deep or posterior cervical adenopathy.  Skin:    General: Skin is warm and dry.  Neurological:     Mental Status: She is alert.  Psychiatric:        Mood and Affect: Mood normal.      Assessment and Plan:  Jamie Gentry is a 26 y.o. female presenting to the [facility] Department for STI screening  1. Morbid obesity ([place])   2. Screening examination for venereal disease Treat wet mount per standing orders Immunization nurse consult - Fajardo Vance,  YEAST, CLUE - Syphilis Serology, [place] [facility] - HIV [place] [facility] - Chlamydia/Gonorrhea [place] [facility] - Gonococcus culture     No follow-ups on file.  No future appointments.  Herbie Saxon, CNM

## 2022-03-15 NOTE — Progress Notes (Signed)
Pt here for STI screening.  Wet mount reviewed, no treatment needed per provider.  Condoms declined.-Collins Scotland, RN

## 2022-03-20 LAB — GONOCOCCUS CULTURE

## 2022-04-08 ENCOUNTER — Ambulatory Visit: Payer: Medicaid Other | Admitting: Nurse Practitioner

## 2022-04-09 ENCOUNTER — Ambulatory Visit: Payer: Medicaid Other | Admitting: Nurse Practitioner

## 2022-04-09 ENCOUNTER — Encounter: Payer: Self-pay | Admitting: Nurse Practitioner

## 2022-04-09 DIAGNOSIS — Z113 Encounter for screening for infections with a predominantly sexual mode of transmission: Secondary | ICD-10-CM | POA: Diagnosis not present

## 2022-04-09 DIAGNOSIS — A64 Unspecified sexually transmitted disease: Secondary | ICD-10-CM

## 2022-04-09 LAB — WET PREP FOR TRICH, YEAST, CLUE
Trichomonas Exam: NEGATIVE
Yeast Exam: NEGATIVE

## 2022-04-09 LAB — HM HIV SCREENING LAB: HM HIV Screening: NEGATIVE

## 2022-04-09 NOTE — Progress Notes (Signed)
[facility] Department  STI clinic/screening visit [address] [place] [address] [phone]  Subjective:  DAMARIS ABELN is a 26 y.o. female being seen today for an STI screening visit. The patient reports they do not have symptoms.  Patient reports that they do not desire a pregnancy in the next year.   They reported they are not interested in discussing contraception today.    Patient's last menstrual period was 03/27/2022 (approximate).   Patient has the following medical conditions:   Patient Active Problem List   Diagnosis Date Noted   Morbid obesity ([facility]) 03/15/2022    Chief Complaint  Patient presents with   SEXUALLY TRANSMITTED DISEASE    HPI  Patient reports  to clinic today for STD screening. Patient is asymptomatic.    Last HIV test per patient/review of record was 03/15/2022 Patient reports last pap was: Unsure   Screening for MPX risk: Does the patient have an unexplained rash? No Is the patient MSM? No Does the patient endorse multiple sex partners or anonymous sex partners? No Did the patient have close or sexual contact with a person diagnosed with MPX? No Has the patient traveled outside the [place] where MPX is endemic? No Is there a high clinical suspicion for MPX-- evidenced by one of the following No  -Unlikely to be chickenpox  -Lymphadenopathy  -Rash that present in same phase of evolution on any given body part See flowsheet for further details and programmatic requirements.   Immunization history:  Immunization History  Administered Date(s) Administered   Hepatitis B 06/02/1998, 07/16/1998, 02/17/2001   Tdap 12/04/2012     The following portions of the patient's history were reviewed and updated as appropriate: allergies, current medications, past medical history, past social history, past surgical history and problem list.  Objective:  There were no vitals filed for this visit.  Physical Exam Constitutional:       Appearance: Normal appearance.  HENT:     Head: Normocephalic. No abrasion, masses or laceration. Hair is normal.     Mouth/Throat:     Mouth: No oral lesions.     Pharynx: No oropharyngeal exudate or posterior oropharyngeal erythema.     Tonsils: No tonsillar exudate or tonsillar abscesses.  Eyes:     General: Lids are normal.        Right eye: No discharge.        Left eye: No discharge.     Conjunctiva/sclera: Conjunctivae normal.     Right eye: No exudate.    Left eye: No exudate. Abdominal:     General: Abdomen is flat.     Palpations: Abdomen is soft.     Tenderness: There is no abdominal tenderness. There is no rebound.  Genitourinary:    Comments: Deferred, patient desires to self collect  Musculoskeletal:     Cervical back: Full passive range of motion without pain, normal range of motion and neck supple.  Lymphadenopathy:     Cervical: No cervical adenopathy.     Right cervical: No superficial, deep or posterior cervical adenopathy.    Left cervical: No superficial, deep or posterior cervical adenopathy.     Upper Body:     Right upper body: No supraclavicular, axillary or epitrochlear adenopathy.     Left upper body: No supraclavicular, axillary or epitrochlear adenopathy.  Skin:    General: Skin is warm and dry.     Findings: No lesion or rash.  Neurological:     Mental Status:  She is alert and oriented to person, place, and time.  Psychiatric:        Attention and Perception: Attention normal.        Mood and Affect: Mood normal.        Speech: Speech normal.        Behavior: Behavior normal. Behavior is cooperative.      Assessment and Plan:  ELLENI MOZINGO is a 26 y.o. female presenting to the [facility] Department for STI screening  1. Sexually transmissible disease -26 year old female in clinic today for STD screening. -Patient accepted all screenings including vaginal CT/GC, wet prep,  and bloodwork for HIV/RPR.  Patient meets  criteria for HepB screening? No. Ordered? No - low risk  Patient meets criteria for HepC screening? Yes. Ordered? No - refused   Treat wet prep per standing order Discussed time line for State Lab results and that patient will be called with positive results and encouraged patient to call if she had not heard in 2 weeks.  Counseled to return or seek care for continued or worsening symptoms Recommended condom use with all sex  Patient is currently not using  contraception  to prevent pregnancy.    - WET PREP FOR TRICH, YEAST, CLUE - Chlamydia/Gonorrhea [place] [facility] - HIV [place] [facility] - Syphilis Serology, [place] [facility]     Return if symptoms worsen or fail to improve.    Glenna Fellows, FNP

## 2022-04-26 ENCOUNTER — Ambulatory Visit: Payer: Medicaid Other

## 2022-05-30 ENCOUNTER — Other Ambulatory Visit: Payer: Self-pay

## 2022-05-30 ENCOUNTER — Encounter: Payer: Self-pay | Admitting: Emergency Medicine

## 2022-05-30 ENCOUNTER — Emergency Department
Admission: EM | Admit: 2022-05-30 | Discharge: 2022-05-30 | Disposition: A | Discharge status: Home / Self Care | Payer: Medicaid Other | Attending: Emergency Medicine | Admitting: Emergency Medicine

## 2022-05-30 DIAGNOSIS — W57XXXA Bitten or stung by nonvenomous insect and other nonvenomous arthropods, initial encounter: Secondary | ICD-10-CM | POA: Diagnosis not present

## 2022-05-30 DIAGNOSIS — S80861A Insect bite (nonvenomous), right lower leg, initial encounter: Secondary | ICD-10-CM | POA: Diagnosis present

## 2022-05-30 DIAGNOSIS — L03115 Cellulitis of right lower limb: Secondary | ICD-10-CM | POA: Insufficient documentation

## 2022-05-30 MED ORDER — ACETAMINOPHEN 325 MG PO TABS
650.0000 mg | ORAL_TABLET | Freq: Once | ORAL | Status: AC
  Administered 2022-05-30: 650 mg via ORAL
  Filled 2022-05-30: qty 2

## 2022-05-30 MED ORDER — FAMOTIDINE 20 MG PO TABS
20.0000 mg | ORAL_TABLET | Freq: Once | ORAL | Status: AC
  Administered 2022-05-30: 20 mg via ORAL
  Filled 2022-05-30: qty 1

## 2022-05-30 MED ORDER — CEPHALEXIN 500 MG PO CAPS: 500.0000 mg | ORAL_CAPSULE | Freq: Four times a day (QID) | ORAL | 0 refills | Status: AC

## 2022-05-30 NOTE — ED Triage Notes (Signed)
Pt reports was stung by a bee 2 days ago and the area on her leg where is stung her is now red and painful.

## 2022-05-30 NOTE — Discharge Instructions (Signed)
Take the antibiotics as prescribed you may also use Benadryl and Pepcid per package instructions for possible localized reaction.  Please return if the redness is worsening, if you develop red streak up your leg, fevers, chills, worsening pain or swelling, or any other concerns.  It was a pleasure caring for you today.

## 2022-05-30 NOTE — ED Provider Notes (Signed)
[facility] Provider Note    Event Date/Time   First MD Initiated Contact with Patient 05/30/22 1504     (approximate)   History   Insect Bite   HPI  Jamie Gentry is a 26 y.o. female with a past medical history of obesity presents today for evaluation of bee sting.  Patient reports that she was stung by a bee 2 days ago to her right inner lower leg and her right upper thigh.  She reports that the area on her right inner lower leg has become increasingly more red, itchy, and slightly painful.  She denies numbness or tingling.  She has not had any fevers or chills.  She is able to ambulate.  She has not ever been stung by a bee before.  Patient Active Problem List   Diagnosis Date Noted   Morbid obesity (HCC) 03/15/2022          Physical Exam   Triage Vital Signs: ED Triage Vitals  Enc Vitals Group     BP 05/30/22 1310 118/66     Pulse Rate 05/30/22 1310 70     Resp 05/30/22 1310 16     Temp 05/30/22 1310 98.5 F (36.9 C)     Temp Source 05/30/22 1310 Oral     SpO2 05/30/22 1310 100 %     Weight 05/30/22 1252 189 lb 9.5 oz (86 kg)     Height 05/30/22 1252 5\' 3"  (1.6 m)     Head Circumference --      Peak Flow --      Pain Score 05/30/22 1252 10     Pain Loc --      Pain Edu? --      Excl. in GC? --     Most recent vital signs: Vitals:   05/30/22 1310 05/30/22 1617  BP: 118/66 120/70  Pulse: 70 70  Resp: 16   Temp: 98.5 F (36.9 C) 98.4 F (36.9 C)  SpO2: 100% 99%    Physical Exam Vitals and nursing note reviewed.  Constitutional:      General: Awake and alert. No acute distress.    Appearance: Normal appearance. The patient is overweight.  HENT:     Head: Normocephalic and atraumatic.     Mouth: Mucous membranes are moist.  Eyes:     General: PERRL. Normal EOMs        Right eye: No discharge.        Left eye: No discharge.     Conjunctiva/sclera: Conjunctivae normal.  Cardiovascular:     Rate and Rhythm: Normal rate  and regular rhythm.     Pulses: Normal pulses.     Heart sounds: Normal heart sounds Pulmonary:     Effort: Pulmonary effort is normal. No respiratory distress.     Breath sounds: Normal breath sounds.  Abdominal:     Abdomen is soft. There is no abdominal tenderness. Musculoskeletal:        General: No swelling. Normal range of motion.     Cervical back: Normal range of motion and neck supple.  Skin:    General: Skin is warm and dry.     Capillary Refill: Capillary refill takes less than 2 seconds.     Findings: Right lower extremity with erythema extending from the upper medial portion of the lower lower leg extending inferiorly to the ankle.  Erythema is not circumferential.  It is warm to touch.  No open wounds.  No crepitus.  No weeping or drainage.  Normal pedal pulses. Neurological:     Mental Status: The patient is awake and alert.      ED Results / Procedures / Treatments   Labs (all labs ordered are listed, but only abnormal results are displayed) Labs Reviewed - No data to display   EKG     RADIOLOGY     PROCEDURES:  Critical Care performed:   Procedures   MEDICATIONS ORDERED IN ED: Medications  famotidine (PEPCID) tablet 20 mg (20 mg Oral Given 05/30/22 1538)  acetaminophen (TYLENOL) tablet 650 mg (650 mg Oral Given 05/30/22 1538)     IMPRESSION / MDM / ASSESSMENT AND PLAN / ED COURSE  I reviewed the triage vital signs and the nursing notes.   Differential diagnosis includes, but is not limited to, localized reaction, cellulitis, no fluctuance to suggest abscess.  No hemodynamic instability, pain out of proportion, fever to suggest deep space or necrotizing infection.  Patient is able to ambulate without difficulty.  She has no joint effusion, full and normal range of motion of her knee and ankle, do not suspect septic joint.  Patient was treated initially with Pepcid (Benadryl was held given that she reports that she is driving) for possible localized  reaction without significant change in the size of the erythema.  We will treat as early cellulitis given that the one on her upper thigh is much smaller and is not reacting as much as the one on her lower leg.  We discussed strict return precautions and the importance of close outpatient follow-up.  Patient understands and agrees with plan.  Discharged in stable condition.   Patient's presentation is most consistent with acute illness / injury with system symptoms.      FINAL CLINICAL IMPRESSION(S) / ED DIAGNOSES   Final diagnoses:  Insect bite of right lower leg, initial encounter  Cellulitis of right lower extremity     Rx / DC Orders   ED Discharge Orders          Ordered    cephALEXin (KEFLEX) 500 MG capsule  4 times daily        05/30/22 1603             Note:  This document was prepared using Dragon voice recognition software and may include unintentional dictation errors.   Keturah Shavers 05/30/22 1731    Arnaldo Natal, MD 05/30/22 Ebony Cargo

## 2022-06-08 ENCOUNTER — Ambulatory Visit: Payer: Medicaid Other

## 2022-06-17 ENCOUNTER — Ambulatory Visit: Payer: Medicaid Other

## 2022-06-24 ENCOUNTER — Emergency Department
Admission: EM | Admit: 2022-06-24 | Discharge: 2022-06-24 | Disposition: A | Discharge status: Home / Self Care | Payer: Medicaid Other | Attending: Emergency Medicine | Admitting: Emergency Medicine

## 2022-06-24 ENCOUNTER — Emergency Department: Payer: Medicaid Other

## 2022-06-24 DIAGNOSIS — R062 Wheezing: Secondary | ICD-10-CM | POA: Insufficient documentation

## 2022-06-24 DIAGNOSIS — R079 Chest pain, unspecified: Secondary | ICD-10-CM | POA: Diagnosis present

## 2022-06-24 DIAGNOSIS — R0789 Other chest pain: Secondary | ICD-10-CM | POA: Diagnosis not present

## 2022-06-24 LAB — CBC WITH DIFFERENTIAL/PLATELET
Abs Immature Granulocytes: 0.02 10*3/uL (ref 0.00–0.07)
Basophils Absolute: 0 10*3/uL (ref 0.0–0.1)
Basophils Relative: 0 %
Eosinophils Absolute: 0.1 10*3/uL (ref 0.0–0.5)
Eosinophils Relative: 1 %
HCT: 35.8 % — ABNORMAL LOW (ref 36.0–46.0)
Hemoglobin: 11.8 g/dL — ABNORMAL LOW (ref 12.0–15.0)
Immature Granulocytes: 0 %
Lymphocytes Relative: 26 %
Lymphs Abs: 1.9 10*3/uL (ref 0.7–4.0)
MCH: 29.4 pg (ref 26.0–34.0)
MCHC: 33 g/dL (ref 30.0–36.0)
MCV: 89.1 fL (ref 80.0–100.0)
Monocytes Absolute: 0.7 10*3/uL (ref 0.1–1.0)
Monocytes Relative: 9 %
Neutro Abs: 4.7 10*3/uL (ref 1.7–7.7)
Neutrophils Relative %: 64 %
Platelets: 224 10*3/uL (ref 150–400)
RBC: 4.02 MIL/uL (ref 3.87–5.11)
RDW: 13.4 % (ref 11.5–15.5)
WBC: 7.4 10*3/uL (ref 4.0–10.5)
nRBC: 0 % (ref 0.0–0.2)

## 2022-06-24 LAB — COMPREHENSIVE METABOLIC PANEL
ALT: 12 U/L (ref 0–44)
AST: 14 U/L — ABNORMAL LOW (ref 15–41)
Albumin: 4.2 g/dL (ref 3.5–5.0)
Alkaline Phosphatase: 52 U/L (ref 38–126)
Anion gap: 7 (ref 5–15)
BUN: 15 mg/dL (ref 6–20)
CO2: 21 mmol/L — ABNORMAL LOW (ref 22–32)
Calcium: 9.1 mg/dL (ref 8.9–10.3)
Chloride: 106 mmol/L (ref 98–111)
Creatinine, Ser: 0.81 mg/dL (ref 0.44–1.00)
GFR, Estimated: >60 mL/min (ref >60)
Glucose, Bld: 93 mg/dL (ref 70–99)
Potassium: 4.2 mmol/L (ref 3.5–5.1)
Sodium: 134 mmol/L — ABNORMAL LOW (ref 135–145)
Total Bilirubin: 0.4 mg/dL (ref 0.3–1.2)
Total Protein: 7.5 g/dL (ref 6.5–8.1)

## 2022-06-24 LAB — TROPONIN I (HIGH SENSITIVITY): Troponin I (High Sensitivity): <2 ng/L (ref <18)

## 2022-06-24 LAB — LIPASE, BLOOD: Lipase: 26 U/L (ref 11–51)

## 2022-06-24 LAB — D-DIMER, QUANTITATIVE: D-Dimer, Quant: 0.31 ug/mL-FEU (ref 0.00–0.50)

## 2022-06-24 MED ORDER — HYDROCODONE-ACETAMINOPHEN 5-325 MG PO TABS
1.0000 | ORAL_TABLET | Freq: Four times a day (QID) | ORAL | 0 refills | Status: AC | PRN
Start: 2022-06-24 — End: 2023-06-24

## 2022-06-24 MED ORDER — PREDNISONE 20 MG PO TABS: 60.0000 mg | ORAL_TABLET | Freq: Every day | ORAL | 0 refills | Status: AC

## 2022-06-24 MED ORDER — ALBUTEROL SULFATE HFA 108 (90 BASE) MCG/ACT IN AERS
2.0000 | INHALATION_SPRAY | RESPIRATORY_TRACT | 1 refills | Status: AC | PRN
Start: 2022-06-24 — End: ?

## 2022-06-24 MED ORDER — ALBUTEROL SULFATE (2.5 MG/3ML) 0.083% IN NEBU
2.5000 mg | INHALATION_SOLUTION | Freq: Once | RESPIRATORY_TRACT | Status: AC
  Administered 2022-06-24: 2.5 mg via RESPIRATORY_TRACT
  Filled 2022-06-24: qty 3

## 2022-06-24 MED ORDER — OXYCODONE-ACETAMINOPHEN 5-325 MG PO TABS
1.0000 | ORAL_TABLET | Freq: Once | ORAL | Status: AC
  Administered 2022-06-24: 1 via ORAL
  Filled 2022-06-24: qty 1

## 2022-06-24 MED ORDER — PREDNISONE 20 MG PO TABS
60.0000 mg | ORAL_TABLET | Freq: Once | ORAL | Status: AC
  Administered 2022-06-24: 60 mg via ORAL
  Filled 2022-06-24: qty 3

## 2022-06-24 NOTE — ED Triage Notes (Signed)
Pt comes from home via [facility] c/o chest pain. Pt states it started as back pain 2 days ago that has progressively gone into leftshoulder area, neck, left breast area and left arm, pt states chest hurts upon palpation. EMS gave 4 baby aspirins en route.

## 2022-06-24 NOTE — ED Provider Notes (Signed)
[facility] Provider Note    Event Date/Time   First MD Initiated Contact with Patient 06/24/22 [phone]     (approximate)   History   Chest Pain   HPI  Jamie Gentry is a 26 y.o. female  here with chest pain, SOB. Pt reports 2 days of left upper chest and back pain, described as a sharp, positional, but also pleuritic left upper chest pain. Pain is worse w/ coughing and she's had increased cough as well. Has been wheezing as well - no h/o asthma but does smoke. No h/o DVT, no leg swelling, no OCP use. No fevers. No specific heavy lifting/injuries. No numbness or weakness of bl UE or LE. No rash. No specific alleviating factors.       Physical Exam   Triage Vital Signs: ED Triage Vitals  Enc Vitals Group     BP 06/24/22 0600 110/72     Pulse Rate 06/24/22 0600 81     Resp 06/24/22 0600 20     Temp 06/24/22 0600 98.6 F (37 C)     Temp Source 06/24/22 0600 Oral     SpO2 06/24/22 0600 99 %     Weight 06/24/22 0558 225 lb (102.1 kg)     Height 06/24/22 0558 5\' 3"  (1.6 m)     Head Circumference --      Peak Flow --      Pain Score 06/24/22 0558 10     Pain Loc --      Pain Edu? --      Excl. in GC? --     Most recent vital signs: Vitals:   06/24/22 0600  BP: 110/72  Pulse: 81  Resp: 20  Temp: 98.6 F (37 C)  SpO2: 99%     General: Awake, no distress.  CV:  Good peripheral perfusion. RRR. Resp:  Normal effort. Mild expiratory wheezes noted diffusely, normal aeration and WOB. Abd:  No distention.  Other:  Moderate chest wall TTP and TTP over left upper trapezius. No skin lesions or rash. No swelling.   ED Results / Procedures / Treatments   Labs (all labs ordered are listed, but only abnormal results are displayed) Labs Reviewed  CBC WITH DIFFERENTIAL/PLATELET - Abnormal; Notable for the following components:      Result Value   Hemoglobin 11.8 (*)    HCT 35.8 (*)    All other components within normal limits  COMPREHENSIVE  METABOLIC PANEL - Abnormal; Notable for the following components:   Sodium 134 (*)    CO2 21 (*)    AST 14 (*)    All other components within normal limits  LIPASE, BLOOD  D-DIMER, QUANTITATIVE  TROPONIN I (HIGH SENSITIVITY)     EKG Normal sinus rhythm, ventricular rate 85.  PR 172, QRS 90, QTc 440.  No acute ST elevations or depressions.  No EKG evidence of acute ischemia or infarct.   RADIOLOGY CXR: Clear, no PNA   I also independently reviewed and agree with radiologist interpretations.   PROCEDURES:  Critical Care performed: No  .1-3 Lead EKG Interpretation  Performed by: 06/26/22, MD Authorized by: Shaune Pollack, MD     Interpretation: normal     ECG rate:  80-90   ECG rate assessment: normal     Rhythm: sinus rhythm     Ectopy: none     Conduction: normal   Comments:     Chest pain     MEDICATIONS ORDERED IN ED:  Medications  albuterol (PROVENTIL) (2.5 MG/3ML) 0.083% nebulizer solution 2.5 mg (has no administration in time range)  predniSONE (DELTASONE) tablet 60 mg (has no administration in time range)  oxyCODONE-acetaminophen (PERCOCET/ROXICET) 5-325 MG per tablet 1 tablet (has no administration in time range)     IMPRESSION / MDM / ASSESSMENT AND PLAN / ED COURSE  I reviewed the triage vital signs and the nursing notes.                               The patient is on the cardiac monitor to evaluate for evidence of arrhythmia and/or significant heart rate changes.   Ddx:  Differential includes the following, with pertinent life- or limb-threatening emergencies considered:  Pleurisy, PE, PNA, asthma/copd with chest wall pain, costochondritis, GERD/gastritis  Patient's presentation is most consistent with acute presentation with potential threat to life or bodily function.  MDM:  26 year old previously healthy female here with left upper chest pain.  On exam, patient has mild wheezing and clinically I suspect underlying COPD/emphysema  given extensive smoking history with bronchospasm and possible pleuritic pain versus musculoskeletal pain in the setting of increased coughing.  Chest x-ray reviewed and shows no evidence of pneumonia or pneumothorax.  Troponin negative and EKG is nonischemic despite constant symptoms for greater than 24 hours, do not suspect ACS.  D-dimer negative, do not suspect PE or dissection.  CMP unremarkable and she has no abdominal pain.  Lipase is normal.  CBC without leukocytosis or anemia.  We will plan to treat with steroids, albuterol, as well as brief course of analgesia as needed.  Return precautions given.   MEDICATIONS GIVEN IN ED: Medications  albuterol (PROVENTIL) (2.5 MG/3ML) 0.083% nebulizer solution 2.5 mg (has no administration in time range)  predniSONE (DELTASONE) tablet 60 mg (has no administration in time range)  oxyCODONE-acetaminophen (PERCOCET/ROXICET) 5-325 MG per tablet 1 tablet (has no administration in time range)     Consults:     EMR reviewed       FINAL CLINICAL IMPRESSION(S) / ED DIAGNOSES   Final diagnoses:  Wheezing  Chest wall pain     Rx / DC Orders   ED Discharge Orders          Ordered    predniSONE (DELTASONE) 20 MG tablet  Daily        06/24/22 0724    albuterol (VENTOLIN HFA) 108 (90 Base) MCG/ACT inhaler  Every 4 hours PRN        06/24/22 0724    HYDROcodone-acetaminophen (NORCO/VICODIN) 5-325 MG tablet  Every 6 hours PRN        06/24/22 0724             Note:  This document was prepared using Dragon voice recognition software and may include unintentional dictation errors.   Shaune Pollack, MD 06/24/22 [phone]

## 2022-07-14 ENCOUNTER — Ambulatory Visit: Payer: Medicaid Other

## 2022-08-30 ENCOUNTER — Ambulatory Visit: Payer: Medicaid Other

## 2022-10-15 ENCOUNTER — Ambulatory Visit: Payer: Medicaid Other

## 2022-10-26 ENCOUNTER — Encounter: Payer: Self-pay | Admitting: Nurse Practitioner

## 2022-10-26 ENCOUNTER — Ambulatory Visit: Payer: Medicaid Other | Admitting: Nurse Practitioner

## 2022-10-26 DIAGNOSIS — Z113 Encounter for screening for infections with a predominantly sexual mode of transmission: Secondary | ICD-10-CM

## 2022-10-26 LAB — HM HEPATITIS C SCREENING LAB: HM Hepatitis Screen: NEGATIVE

## 2022-10-26 LAB — HEPATITIS B SURFACE ANTIGEN

## 2022-10-26 LAB — WET PREP FOR TRICH, YEAST, CLUE
Trichomonas Exam: NEGATIVE
Yeast Exam: NEGATIVE

## 2022-10-26 LAB — HM HIV SCREENING LAB: HM HIV Screening: NEGATIVE

## 2022-10-26 NOTE — Progress Notes (Signed)
[facility] Department  STI clinic/screening visit [place] [address] [phone]  Subjective:  Jamie Gentry is a 27 y.o. female being seen today for an STI screening visit. The patient reports they do have symptoms.  Patient reports that they do not desire a pregnancy in the next year.   They reported they are not interested in discussing contraception today.    No LMP recorded (approximate).  Patient has the following medical conditions:   Patient Active Problem List   Diagnosis Date Noted   Morbid obesity ([place]) 03/15/2022    Chief Complaint  Patient presents with   SEXUALLY TRANSMITTED DISEASE    STI screening.  No symptoms    HPI  Patient reports to clinic today for STD screening.  Patient reports some abnormal vaginal bleeding that occurred a couple months ago, no other signs and symptoms reported.   Does the patient using douching products? No  Last HIV test per patient/review of record was  Lab Results  Component Value Date   HMHIVSCREEN Negative - Validated 04/09/2022    Lab Results  Component Value Date   HIV Nonreactive 06/06/2018   Patient reports last pap was around 3 years ago.   Screening for MPX risk: Does the patient have an unexplained rash? No Is the patient MSM? No Does the patient endorse multiple sex partners or anonymous sex partners? Yes Did the patient have close or sexual contact with a person diagnosed with MPX? No Has the patient traveled outside the [place] where MPX is endemic? No Is there a high clinical suspicion for MPX-- evidenced by one of the following No  -Unlikely to be chickenpox  -Lymphadenopathy  -Rash that present in same phase of evolution on any given body part See flowsheet for further details and programmatic requirements.   Immunization history:  Immunization History  Administered Date(s) Administered   Hepatitis B 06/02/1998, 07/16/1998, 02/17/2001   MMR 06/02/1998, 07/16/1998    Tdap 12/04/2012   Varicella 02/28/2013     The following portions of the patient's history were reviewed and updated as appropriate: allergies, current medications, past medical history, past social history, past surgical history and problem list.  Objective:  There were no vitals filed for this visit.  Physical Exam Constitutional:      Appearance: Normal appearance.  HENT:     Head: Normocephalic. No abrasion, masses or laceration. Hair is normal.     Right Ear: External ear normal.     Left Ear: External ear normal.     Nose: Nose normal.     Mouth/Throat:     Lips: Pink.     Mouth: Mucous membranes are moist. No oral lesions.     Pharynx: No oropharyngeal exudate or posterior oropharyngeal erythema.     Tonsils: No tonsillar exudate or tonsillar abscesses.  Eyes:     General: Lids are normal.        Right eye: No discharge.        Left eye: No discharge.     Conjunctiva/sclera: Conjunctivae normal.     Right eye: No exudate.    Left eye: No exudate. Pulmonary:     Effort: Pulmonary effort is normal.  Abdominal:     General: Abdomen is flat.     Palpations: Abdomen is soft.     Tenderness: There is no abdominal tenderness. There is no rebound.  Genitourinary:    Pubic Area: No rash or pubic lice.  Labia:        Right: No rash, tenderness, lesion or injury.        Left: No rash, tenderness, lesion or injury.      Vagina: Normal. No vaginal discharge, erythema or lesions.     Cervix: No cervical motion tenderness, discharge, lesion or erythema.     Uterus: Not enlarged and not tender.      Rectum: Normal.     Comments: Amount Discharge: small  Odor: No pH: less than 4.5 Adheres to vaginal wall: No Color: Waverly Tarquinio Musculoskeletal:     Cervical back: Full passive range of motion without pain, normal range of motion and neck supple.  Lymphadenopathy:     Cervical: No cervical adenopathy.     Right cervical: No superficial, deep or posterior cervical  adenopathy.    Left cervical: No superficial, deep or posterior cervical adenopathy.     Upper Body:     Right upper body: No supraclavicular, axillary or epitrochlear adenopathy.     Left upper body: No supraclavicular, axillary or epitrochlear adenopathy.     Lower Body: No right inguinal adenopathy. No left inguinal adenopathy.  Skin:    General: Skin is warm and dry.     Findings: No lesion or rash.  Neurological:     Mental Status: She is alert and oriented to person, place, and time.  Psychiatric:        Attention and Perception: Attention normal.        Mood and Affect: Mood normal.        Speech: Speech normal.        Behavior: Behavior normal. Behavior is cooperative.      Assessment and Plan:  [name] is a 27 y.o. female presenting to the [facility] Department for STI screening  1. Screening examination for venereal disease Patient accepted all screenings including oral, vagina, anal CT/GC, wet prep and bloodwork for HIV/RPR.  Patient meets criteria for HepB screening? Yes. Ordered? Yes Patient meets criteria for HepC screening? Yes. Ordered? Yes  Treat wet prep per standing order Discussed time line for State Lab results and that patient will be called with positive results and encouraged patient to call if she had not heard in 2 weeks.  Counseled to return or seek care for continued or worsening symptoms Recommended condom use with all sex  Patient is currently not using  contraception  to prevent pregnancy.    - HIV/HCV [place] [facility] - Syphilis Serology, [place] [facility] - HBV Antigen/Antibody State Lab - [place], YEAST, CLUE  Total time spent: 30 minutes   Return if symptoms worsen or fail to improve.   Gregary Cromer, FNP

## 2022-11-02 ENCOUNTER — Telehealth: Payer: Self-pay

## 2022-11-02 NOTE — Telephone Encounter (Signed)
Calling pt re positive gonorrhea result from 10/26/22 oropharyngeal specimen. Needs tx appt. (Post tx, TOC in 3 weeks due to [facility] in throat.)  Phone call to pt at [phone]. Received message that voicemail not set up; unable to leave message. Tried twice.  Sent MyChart message.

## 2022-11-03 NOTE — Telephone Encounter (Signed)
Phone call to pt at [phone]. Left message on voicemail that RN with [facility] is calling re TR. Please call Mechele Kittleson at [phone].

## 2022-11-09 NOTE — Telephone Encounter (Signed)
Phone call to pt at [phone]. Pt answered and confirmed identity. Counseled pt re +GC result from throat specimen. States NKA.  Tx appt scheduled for 11/09/22.  Needs TOC in 3 weeks.

## 2022-11-10 ENCOUNTER — Telehealth: Payer: Self-pay | Admitting: Family Medicine

## 2022-11-10 NOTE — Telephone Encounter (Signed)
Patient called stating she missed coming by yesterday to take Medication and wants to know if she can come by today for it.

## 2022-11-11 ENCOUNTER — Ambulatory Visit: Payer: Medicaid Other

## 2022-11-11 VITALS — Ht 64.0 in | Wt 207.5 lb

## 2022-11-11 DIAGNOSIS — A549 Gonococcal infection, unspecified: Secondary | ICD-10-CM

## 2022-11-11 MED ORDER — CEFTRIAXONE SODIUM 500 MG IJ SOLR
500.0000 mg | Freq: Once | INTRAMUSCULAR | Status: AC
  Administered 2022-11-11: 500 mg via INTRAMUSCULAR

## 2022-11-11 NOTE — Progress Notes (Signed)
Pt is here for treatment of Gonorrhea.  Ceftriaxone 500 mg given IM in LUOQ per SO.  Pt tolerated well.  Pt refused a contact card, stating that she has already informed her partner.  Follow-up appointment made for 2/15.  Condoms given.  Windle Guard, RN

## 2022-11-11 NOTE — Telephone Encounter (Signed)
Pt to clinic for tx on 11/11/22.

## 2022-11-25 ENCOUNTER — Ambulatory Visit: Payer: Medicaid Other

## 2022-12-09 ENCOUNTER — Ambulatory Visit: Payer: Medicaid Other

## 2022-12-30 ENCOUNTER — Ambulatory Visit: Payer: Medicaid Other

## 2023-01-27 ENCOUNTER — Ambulatory Visit: Payer: Medicaid Other

## 2023-03-03 ENCOUNTER — Encounter: Payer: Self-pay | Admitting: Advanced Practice Midwife

## 2023-03-03 ENCOUNTER — Ambulatory Visit: Payer: Medicaid Other | Admitting: Advanced Practice Midwife

## 2023-03-03 DIAGNOSIS — Z113 Encounter for screening for infections with a predominantly sexual mode of transmission: Secondary | ICD-10-CM

## 2023-03-03 LAB — WET PREP FOR TRICH, YEAST, CLUE
Trichomonas Exam: NEGATIVE
Yeast Exam: NEGATIVE

## 2023-03-03 LAB — HM HIV SCREENING LAB: HM HIV Screening: NEGATIVE

## 2023-03-03 LAB — HM HEPATITIS C SCREENING LAB: HM Hepatitis Screen: NEGATIVE

## 2023-03-03 NOTE — Progress Notes (Signed)
Pt is here for STD screening.  Wet mount results reviewed, no treatment required per standing order.  Condoms declined.  

## 2023-03-03 NOTE — Progress Notes (Signed)
[facility] Department  STI clinic/screening visit [address] [place] [address] [phone]  Subjective:  Jamie Gentry is a 27 y.o. SBF exsmoker G3P3 female being seen today for an STI screening visit. The patient reports they do not have symptoms.  Patient reports that they do not desire a pregnancy in the next year.   They reported they are not interested in discussing contraception today.    Patient's last menstrual period was 02/26/2023 (exact date).  Patient has the following medical conditions:   Patient Active Problem List   Diagnosis Date Noted   Morbid obesity (HCC) 207 lb 03/15/2022    Chief Complaint  Patient presents with   SEXUALLY TRANSMITTED DISEASE    No symptoms     HPI  Patient reports asymptomatic. Last sex 02/17/23 with condom; with current partner x 1 year; 1 partner in last 3 mo. LMP 02/27/23. Last cig 01/2023. Last MJ 2023. Last ETOH 02/26/23 (1 mixed drink). Last pap maybe 4 years ago per pt and wnl.   Does the patient using douching products? No  Last HIV test per patient/review of record was  Lab Results  Component Value Date   HMHIVSCREEN Negative - Validated 10/26/2022    Lab Results  Component Value Date   HIV Nonreactive 06/06/2018   Patient reports last pap was No results found for: "DIAGPAP" No results found for: "SPECADGYN"  Screening for MPX risk: Does the patient have an unexplained rash? No Is the patient MSM? No Does the patient endorse multiple sex partners or anonymous sex partners? No Did the patient have close or sexual contact with a person diagnosed with MPX? No Has the patient traveled outside the [place] where MPX is endemic? No Is there a high clinical suspicion for MPX-- evidenced by one of the following No  -Unlikely to be chickenpox  -Lymphadenopathy  -Rash that present in same phase of evolution on any given body part See flowsheet for further details and programmatic requirements.    Immunization history:  Immunization History  Administered Date(s) Administered   Hepatitis B 06/02/1998, 07/16/1998, 02/17/2001   MMR 06/02/1998, 07/16/1998   Tdap 12/04/2012   Varicella 02/28/2013     The following portions of the patient's history were reviewed and updated as appropriate: allergies, current medications, past medical history, past social history, past surgical history and problem list.  Objective:  There were no vitals filed for this visit.  Physical Exam Vitals and nursing note reviewed.  Constitutional:      Appearance: Normal appearance. She is obese.  HENT:     Head: Normocephalic and atraumatic.     Mouth/Throat:     Mouth: Mucous membranes are moist.     Pharynx: Oropharynx is clear. No oropharyngeal exudate or posterior oropharyngeal erythema.  Eyes:     Conjunctiva/sclera: Conjunctivae normal.  Pulmonary:     Effort: Pulmonary effort is normal.  Abdominal:     Palpations: Abdomen is soft. There is no mass.     Tenderness: There is no abdominal tenderness. There is no rebound.     Comments: Soft without masses or tenderness, poor tone  Genitourinary:    General: Normal vulva.     Exam position: Lithotomy position.     Pubic Area: No rash or pubic lice.      Labia:        Right: No rash or lesion.        Left: No rash or lesion.  Vagina: Vaginal discharge (light brown malodorous leukorrhea, ph<4.5) present. No erythema, bleeding or lesions.     Cervix: Normal.     Uterus: Normal.      Adnexa: Right adnexa normal and left adnexa normal.     Rectum: Normal.     Comments: pH = <4.5 Lymphadenopathy:     Head:     Right side of head: No preauricular or posterior auricular adenopathy.     Left side of head: No preauricular or posterior auricular adenopathy.     Cervical: No cervical adenopathy.     Right cervical: No superficial, deep or posterior cervical adenopathy.    Left cervical: No superficial, deep or posterior cervical adenopathy.      Upper Body:     Right upper body: No supraclavicular, axillary or epitrochlear adenopathy.     Left upper body: No supraclavicular, axillary or epitrochlear adenopathy.     Lower Body: No right inguinal adenopathy. No left inguinal adenopathy.  Skin:    General: Skin is warm and dry.     Findings: No rash.  Neurological:     Mental Status: She is alert and oriented to person, place, and time.      Assessment and Plan:  Jamie Gentry is a 27 y.o. female presenting to the [facility] Department for STI screening  1. Screening examination for venereal disease Treat wet mount per standing orders Immunization nurse consult  - WET PREP FOR TRICH, YEAST, CLUE - Gonococcus culture - Chlamydia/Gonorrhea [place] [facility] - Syphilis Serology, [place] [facility] - HIV/HCV [place] [facility] - Gonococcus culture   Patient accepted all screenings including oral, vaginal CT/GC and bloodwork for HIV/RPR, and wet prep. Patient meets criteria for HepB screening? Yes. Ordered? no Patient meets criteria for HepC screening? Yes. Ordered? yes  Treat wet prep per standing order Discussed time line for State Lab results and that patient will be called with positive results and encouraged patient to call if she had not heard in 2 weeks.  Counseled to return or seek care for continued or worsening symptoms Recommended repeat testing in 3 months with positive results. Recommended condom use with all sex  Patient is currently using  nothing  to prevent pregnancy.    Return if symptoms worsen or fail to improve.  No future appointments.  Alberteen Spindle, CNM

## 2023-03-08 LAB — GONOCOCCUS CULTURE

## 2023-07-12 ENCOUNTER — Ambulatory Visit: Payer: Medicaid Other

## 2023-07-22 ENCOUNTER — Ambulatory Visit: Payer: Medicaid Other | Admitting: Family Medicine

## 2023-07-22 DIAGNOSIS — Z113 Encounter for screening for infections with a predominantly sexual mode of transmission: Secondary | ICD-10-CM

## 2023-07-22 LAB — WET PREP FOR TRICH, YEAST, CLUE
Trichomonas Exam: NEGATIVE
Yeast Exam: NEGATIVE

## 2023-07-22 LAB — HM HIV SCREENING LAB: HM HIV Screening: NEGATIVE

## 2023-07-22 NOTE — Progress Notes (Signed)
Pt is here for STD screening.  Wet prep reviewed, no treatment per standing order. Pt mentioned possibly being interested in birth control in the future, birth control pamphlet sent home with pt.  All questions answered. Condoms declined. Gaspar Garbe, RN

## 2023-07-22 NOTE — Progress Notes (Signed)
[facility] Department  STI clinic/screening visit [address] [place] [address] [phone]  Subjective:  Jamie Gentry is a 27 y.o. female being seen today for an STI screening visit. The patient reports they do not have symptoms.  Patient reports that they do not desire a pregnancy in the next year.   They reported they are not interested in discussing contraception today.    Patient's last menstrual period was 07/18/2023.  Patient has the following medical conditions:   Patient Active Problem List   Diagnosis Date Noted   Morbid obesity (HCC) 207 lb 03/15/2022    Chief Complaint  Patient presents with   SEXUALLY TRANSMITTED DISEASE    No symptoms check up    HPI  Patient reports to clinic for STI testing. Asymptomatic  Does the patient using douching products? No  Last HIV test per patient/review of record was  Lab Results  Component Value Date   HMHIVSCREEN Negative - Validated 03/03/2023    Lab Results  Component Value Date   HIV Nonreactive 06/06/2018     Last HEPC test per patient/review of record was  Lab Results  Component Value Date   HMHEPCSCREEN Negative-Validated 03/03/2023   No components found for: "HEPC"   Last HEPB test per patient/review of record was No components found for: "HMHEPBSCREEN" No components found for: "HEPC"   Patient reports last pap was unknown- not in system  Screening for MPX risk: Does the patient have an unexplained rash? No Is the patient MSM? No Does the patient endorse multiple sex partners or anonymous sex partners? No Did the patient have close or sexual contact with a person diagnosed with MPX? No Has the patient traveled outside the [place] where MPX is endemic? No Is there a high clinical suspicion for MPX-- evidenced by one of the following No  -Unlikely to be chickenpox  -Lymphadenopathy  -Rash that present in same phase of evolution on any given body part See flowsheet for further  details and programmatic requirements.   Immunization history:  Immunization History  Administered Date(s) Administered   Hepatitis B 06/02/1998, 07/16/1998, 02/17/2001   MMR 06/02/1998, 07/16/1998   Tdap 12/04/2012   Varicella 02/28/2013     The following portions of the patient's history were reviewed and updated as appropriate: allergies, current medications, past medical history, past social history, past surgical history and problem list.  Objective:  There were no vitals filed for this visit.  Physical Exam Vitals and nursing note reviewed.  Constitutional:      Appearance: Normal appearance.  HENT:     Head: Normocephalic.     Mouth/Throat:     Mouth: Mucous membranes are moist.  Cardiovascular:     Rate and Rhythm: Normal rate.  Pulmonary:     Effort: Pulmonary effort is normal.  Abdominal:     Palpations: Abdomen is soft.  Genitourinary:    Comments: Declined genital exam- no symptoms, self swabbed Musculoskeletal:        General: Normal range of motion.  Lymphadenopathy:     Head:     Right side of head: No submandibular, preauricular or posterior auricular adenopathy.     Left side of head: No submandibular, preauricular or posterior auricular adenopathy.     Cervical: No cervical adenopathy.     Upper Body:     Right upper body: No supraclavicular or axillary adenopathy.     Left upper body: No supraclavicular or axillary adenopathy.  Skin:  General: Skin is warm and dry.  Neurological:     Mental Status: She is alert and oriented to person, place, and time.  Psychiatric:        Mood and Affect: Mood normal.      Assessment and Plan:  Jamie Asia D Galvan is a 27 y.o. female presenting to the [facility] Department for STI screening  1. Screening for venereal disease  - Chlamydia/Gonorrhea [place] [facility] - HIV [place] [facility] - Syphilis Serology, [place] [facility] - WET PREP FOR TRICH, YEAST, CLUE - Chlamydia/Gonorrhea [place] [facility] -  Chlamydia/Gonorrhea [place] [facility]   Patient accepted all screenings including vaginal CT/GC and bloodwork for HIV/RPR, and wet prep. Patient meets criteria for HepB screening? No. Ordered? not applicable Patient meets criteria for HepC screening? No. Ordered? not applicable  Treat wet prep per standing order Discussed time line for State Lab results and that patient will be called with positive results and encouraged patient to call if she had not heard in 2 weeks.  Counseled to return or seek care for continued or worsening symptoms Recommended repeat testing in 3 months with positive results. Recommended condom use with all sex  Patient is currently using  nothing  to prevent pregnancy.    Return if symptoms worsen or fail to improve, for STI screening.  No future appointments. Total time spent 20 minutes  Lenice Llamas, [place]

## 2023-07-30 ENCOUNTER — Other Ambulatory Visit: Payer: Self-pay

## 2023-07-30 ENCOUNTER — Emergency Department
Admission: EM | Admit: 2023-07-30 | Discharge: 2023-07-30 | Disposition: A | Discharge status: Home / Self Care | Payer: Medicaid Other | Attending: Emergency Medicine | Admitting: Emergency Medicine

## 2023-07-30 DIAGNOSIS — F10929 Alcohol use, unspecified with intoxication, unspecified: Secondary | ICD-10-CM

## 2023-07-30 DIAGNOSIS — F10129 Alcohol abuse with intoxication, unspecified: Secondary | ICD-10-CM | POA: Insufficient documentation

## 2023-07-30 DIAGNOSIS — Y908 Blood alcohol level of 240 mg/100 ml or more: Secondary | ICD-10-CM | POA: Insufficient documentation

## 2023-07-30 DIAGNOSIS — R112 Nausea with vomiting, unspecified: Secondary | ICD-10-CM | POA: Diagnosis not present

## 2023-07-30 LAB — ETHANOL: Alcohol, Ethyl (B): 266 mg/dL — ABNORMAL HIGH (ref <10)

## 2023-07-30 MED ORDER — ONDANSETRON 4 MG PO TBDP
4.0000 mg | ORAL_TABLET | Freq: Once | ORAL | Status: AC
  Administered 2023-07-30: 4 mg via ORAL
  Filled 2023-07-30: qty 1

## 2023-07-30 MED ORDER — DROPERIDOL 2.5 MG/ML IJ SOLN
1.2500 mg | Freq: Once | INTRAMUSCULAR | Status: AC
  Administered 2023-07-30: 1.25 mg via INTRAVENOUS
  Filled 2023-07-30: qty 2

## 2023-07-30 MED ORDER — DROPERIDOL 2.5 MG/ML IJ SOLN
2.5000 mg | Freq: Once | INTRAMUSCULAR | Status: DC
Start: 2023-07-30 — End: 2023-07-30

## 2023-07-30 NOTE — ED Notes (Signed)
Pt given apple juice and crackers.  

## 2023-07-30 NOTE — ED Notes (Signed)
Ambulatory to restroom
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
## 2023-07-30 NOTE — Discharge Instructions (Signed)
You were seen in the emergency department for alcohol intoxication.  Please seek help from the recommended resources for assistance with your alcohol dependence.  If you have any thoughts of hurting herself or others, please call 911 or return to the emergency department.  Please avoid drug and alcohol use.  Never drive a vehicle or operate machinery while intoxicated.  
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
## 2023-10-26 ENCOUNTER — Ambulatory Visit: Payer: Medicaid Other

## 2024-04-24 ENCOUNTER — Ambulatory Visit

## 2024-04-24 DIAGNOSIS — Z113 Encounter for screening for infections with a predominantly sexual mode of transmission: Secondary | ICD-10-CM | POA: Diagnosis not present

## 2024-04-24 LAB — PREGNANCY, URINE: Preg Test, Ur: NEGATIVE

## 2024-04-24 LAB — WET PREP FOR TRICH, YEAST, CLUE
Clue Cell Exam: NEGATIVE
Trichomonas Exam: NEGATIVE
Yeast Exam: NEGATIVE

## 2024-04-24 LAB — HM HIV SCREENING LAB: HM HIV Screening: NEGATIVE

## 2024-04-24 NOTE — Patient Instructions (Signed)
 STI screening - Today we obtained a vaginal swab to screen for gonorrhea, chlamydia, and trichomonas, oral swab to screen for gonorrhea, and anal swab to screen for gonorrhea - We also obtained a blood sample to screen for HIV and syphilis - If the results are abnormal, I will give you a call.    Estimated time frame for results collected at the [facility] Department: Same day Trichomonas Yeast BV (bacterial vaginosis)  Within 1-2 weeks Gonorrhea Chlamydia  Within 2-3 weeks HIV Syphilis Hepatitis B Hepatitis C

## 2024-04-24 NOTE — Progress Notes (Signed)
 Pt here for STI screening.  Wet mount results reviewed with patient.  No treatment needed as per standing orders.-Lakeva Hollon, RN

## 2024-04-24 NOTE — Progress Notes (Signed)
 [facility] Department STI clinic [address], [address] [place] [address] Main phone: [phone]  STI screening visit  Subjective:  Jamie Asia D Lirette is a 28 y.o. female being seen today for an STI screening visit. The patient reports they do not have symptoms.  Patient reports that they do not desire a pregnancy in the next year.   They reported they are not interested in discussing contraception today.    No LMP recorded. (Menstrual status: Irregular Periods).  Patient has the following medical conditions:  Patient Active Problem List   Diagnosis Date Noted   Morbid obesity (HCC) 207 lb 03/15/2022   Chief Complaint  Patient presents with   SEXUALLY TRANSMITTED DISEASE   HPI Patient reports she would like asymptomatic STI screening. No STI exposures. LMP last month, but not a normal period for her. Her period this month is several days late. No contraceptive methods at this time.   Smokes occasionally, accepted quitline resources.   See flowsheet for further details and programmatic requirements Hyperlink available at the top of the signed note in blue.  Flow sheet content below:  Pregnancy Intention Screening Does the patient want to become pregnant in the next year?: No Does the patient's partner want to become pregnant in the next year?: No Would the patient like to discuss contraceptive options today?: No All Patients Anyone smoke around pt and/or pt's children?: Yes Anyone smoke inside pt's house?: Yes Anyone smoke inside car?: Yes Anyone smoke inside the workplace?: Yes Reason For STD Screen STD Screening: Is asymptomatic Have you ever had an STD?: Yes History of Antibiotic use in the past 2 weeks?: No STD Symptoms Denies all: Yes Risk Factors for Hep B Household, sexual, or needle sharing contact of a person infected with Hep B: No Sexual contact with a person who uses drugs not as prescribed?: No Currently or Ever used drugs not  as prescribed: No HIV Positive: No PRep Patient: No Men who have sex with men: N/A Have Hepatitis C: No History of Incarceration: No History of Homeslessness?: No Anal sex following anal drug use?: No Risk Factors for Hep C Currently using drugs not as prescribed: No Sexual partner(s) currently using drugs as not prescribed: No History of drug use: No HIV Positive: No People with a history of incarceration: No People born between the years of 28 and 31: No Counseling Patient counseled to use condoms with all sex: Condoms declined RTC in 2-3 weeks for test results: Yes Clinic will call if test results abnormal before test result appt.: Yes Test results given to patient Patient counseled to use condoms with all sex: Condoms declined   Screening for MPX risk: Does the patient have an unexplained rash? No Is the patient MSM? No Does the patient endorse multiple sex partners or anonymous sex partners? No Did the patient have close or sexual contact with a person diagnosed with MPX? No Has the patient traveled outside the [place]  where MPX is endemic? No Is there a high clinical suspicion for MPX-- evidenced by one of the following No  -Unlikely to be chickenpox  -Lymphadenopathy  -Rash that present in same phase of evolution on any given body part  Screenings: Last HIV test per patient/review of record was  Lab Results  Component Value Date   HMHIVSCREEN Negative - Validated 07/22/2023    Lab Results  Component Value Date   HIV Nonreactive 06/06/2018     Last HEPC test per patient/review of record was  Lab Results  Component Value Date   HMHEPCSCREEN Negative-Validated 03/03/2023   No components found for: HEPC   Last HEPB test per patient/review of record was No components found for: HMHEPBSCREEN   Patient reports last pap was:   No results found for: SPECADGYN No Cervical Cancer Screening results to display.  Immunization history:  Immunization History   Administered Date(s) Administered   Hepatitis B 06/02/1998, 07/16/1998, 02/17/2001   MMR 06/02/1998, 07/16/1998   Tdap 12/04/2012   Varicella 02/28/2013    The following portions of the patient's history were reviewed and updated as appropriate: allergies, current medications, past medical history, past social history, past surgical history and problem list.  Objective:  There were no vitals filed for this visit.  Physical Exam Vitals and nursing note reviewed.  Constitutional:      Appearance: Normal appearance.  HENT:     Head: Normocephalic.     Mouth/Throat:     Mouth: Mucous membranes are moist.  Cardiovascular:     Rate and Rhythm: Normal rate.  Pulmonary:     Effort: Pulmonary effort is normal.  Genitourinary:    Comments: Declined genital exam- no symptoms, self swabbed Musculoskeletal:        General: Normal range of motion.  Lymphadenopathy:     Head:     Right side of head: No submandibular, preauricular or posterior auricular adenopathy.     Left side of head: No submandibular, preauricular or posterior auricular adenopathy.     Cervical: No cervical adenopathy.     Upper Body:     Right upper body: No supraclavicular or axillary adenopathy.     Left upper body: No supraclavicular or axillary adenopathy.  Skin:    General: Skin is warm and dry.     Capillary Refill: Capillary refill takes less than 2 seconds.     Coloration: Skin is not jaundiced or pale.     Findings: No bruising, erythema, lesion or rash.  Neurological:     General: No focal deficit present.     Mental Status: She is alert and oriented to person, place, and time.  Psychiatric:        Mood and Affect: Mood normal.        Behavior: Behavior normal.    Assessment and Plan:  Jamie Gentry is a 28 y.o. female presenting to the [facility] Department for STI screening  Screening examination for venereal disease -     Pregnancy, urine -     WET PREP FOR TRICH, YEAST,  CLUE -     Gonococcus culture -     Gonococcus culture -     Chlamydia/Gonorrhea [place] [facility] -     HIV [place] [facility] -     Syphilis Serology, [place] [facility]  Patient accepted the following screenings: anal GC culture, oral GC culture, vaginal CT/GC swab, vaginal wet prep, HIV, and RPR Patient meets criteria for HepB screening? No. Ordered? not applicable Patient meets criteria for HepC screening? No. Ordered? not applicable  Treat wet prep per standing order Discussed time line for State Lab results and that patient will be called with positive results and encouraged patient to call if she had not heard in 2 weeks.  Counseled to return or seek care for continued or worsening symptoms Recommended repeat testing in 3 months with positive results. Recommended condom use with all sex for STI prevention.   Patient is currently using no methods to prevent pregnancy.  No follow-ups on file.  No future appointments.  Betsey CHRISTELLA Helling, MD

## 2024-04-29 LAB — GONOCOCCUS CULTURE

## 2024-05-04 ENCOUNTER — Encounter: Payer: Self-pay | Admitting: Family Medicine

## 2024-06-03 ENCOUNTER — Other Ambulatory Visit: Payer: Self-pay

## 2024-06-03 ENCOUNTER — Emergency Department
Admission: EM | Admit: 2024-06-03 | Discharge: 2024-06-03 | Disposition: A | Discharge status: Home / Self Care | Attending: Emergency Medicine | Admitting: Emergency Medicine

## 2024-06-03 DIAGNOSIS — W260XXA Contact with knife, initial encounter: Secondary | ICD-10-CM | POA: Diagnosis not present

## 2024-06-03 DIAGNOSIS — Y92000 Kitchen of unspecified non-institutional (private) residence as  the place of occurrence of the external cause: Secondary | ICD-10-CM | POA: Insufficient documentation

## 2024-06-03 DIAGNOSIS — Y93G3 Activity, cooking and baking: Secondary | ICD-10-CM | POA: Insufficient documentation

## 2024-06-03 DIAGNOSIS — S61411A Laceration without foreign body of right hand, initial encounter: Secondary | ICD-10-CM | POA: Insufficient documentation

## 2024-06-03 MED ORDER — TETANUS-DIPHTH-ACELL PERTUSSIS 5-2.5-18.5 LF-MCG/0.5 IM SUSY
0.5000 mL | PREFILLED_SYRINGE | Freq: Once | INTRAMUSCULAR | Status: DC
  Filled 2024-06-03: qty 0.5

## 2024-06-03 NOTE — ED Triage Notes (Signed)
 Pt comes with c/o laceration. Pt has laceration to right palm outside area. Pt states she was using knife and accidentally cut it. Bleeding controlled currently.

## 2024-06-03 NOTE — Discharge Instructions (Signed)
 Please avoid soaking your hand in water as this will cause the Steri-Strips to fall off prematurely.  Once the Steri-Strips come off in about a week you can apply triple antibiotic ointment and cover with a bandage until fully healed.   Watch for signs of infection including redness, warmth, swelling, pain and pus drainage.  If you develop any of these please return to the ED, urgent care or your primary care provider.  You can take 650 mg of Tylenol  and 600 mg of ibuprofen  every 6 hours as needed for pain. You can use ice, heat, muscle creams and other topical pain relievers as well.  Return to the emergency department with any worsening symptoms.

## 2024-06-03 NOTE — ED Provider Notes (Signed)
 [facility] Provider Note    Event Date/Time   First MD Initiated Contact with Patient 06/03/24 1419     (approximate)   History   Laceration   HPI  Jamie Gentry is a 28 y.o. female patient presents for evaluation of a laceration to the right palm.  She states she cut it with a kitchen knife while cooking last night.  Reports she is not up-to-date on her tetanus shot.     Physical Exam   Triage Vital Signs: ED Triage Vitals  Encounter Vitals Group     BP 06/03/24 1317 (!) 109/55     Girls Systolic BP Percentile --      Girls Diastolic BP Percentile --      Boys Systolic BP Percentile --      Boys Diastolic BP Percentile --      Pulse Rate 06/03/24 1317 98     Resp 06/03/24 1317 18     Temp 06/03/24 1317 98 F (36.7 C)     Temp src --      SpO2 06/03/24 1317 100 %     Weight 06/03/24 1316 220 lb (99.8 kg)     Height 06/03/24 1316 5' 3 (1.6 m)     Head Circumference --      Peak Flow --      Pain Score 06/03/24 1317 10     Pain Loc --      Pain Education --      Exclude from Growth Chart --     Most recent vital signs: Vitals:   06/03/24 1317  BP: (!) 109/55  Pulse: 98  Resp: 18  Temp: 98 F (36.7 C)  SpO2: 100%   General: Awake, no distress.  CV:  Good peripheral perfusion.  Resp:  Normal effort.  Abd:  No distention.  Other:  Approximately 1-1/2 to 2 cm laceration on the palmar side of the right hand below the right pinky finger with serous drainage, neurovascularly intact distal to the laceration, radial pulse 2+ and regular.  Patient able to fully flex and extend all fingers.   ED Results / Procedures / Treatments   Labs (all labs ordered are listed, but only abnormal results are displayed) Labs Reviewed - No data to display   PROCEDURES:  Critical Care performed: No  .Laceration Repair  Date/Time: 06/03/2024 3:00 PM  Performed by: Cleaster Tinnie LABOR, PA-C Authorized by: Cleaster Tinnie LABOR, PA-C    Consent:    Consent obtained:  Verbal   Consent given by:  Patient   Risks, benefits, and alternatives were discussed: yes     Risks discussed:  Infection, pain and poor cosmetic result   Alternatives discussed:  No treatment Universal protocol:    Patient identity confirmed:  Verbally with patient Anesthesia:    Anesthesia method:  None Laceration details:    Location:  Hand   Hand location:  R palm   Length (cm):  2   Depth (mm):  5 Exploration:    Wound exploration: entire depth of wound visualized   Treatment:    Area cleansed with:  Povidone-iodine   Amount of cleaning:  Standard   Irrigation solution:  Sterile saline   Irrigation method:  Syringe Skin repair:    Repair method:  Steri-Strips   Number of Steri-Strips:  5 Approximation:    Approximation:  Close Repair type:    Repair type:  Simple Post-procedure details:    Dressing:  Bulky dressing  Procedure completion:  Tolerated well, no immediate complications    MEDICATIONS ORDERED IN ED: Medications  Tdap (BOOSTRIX) injection 0.5 mL (0.5 mLs Intramuscular Patient Refused/Not Given 06/03/24 1455)     IMPRESSION / MDM / ASSESSMENT AND PLAN / ED COURSE  I reviewed the triage vital signs and the nursing notes.                             28 year old female presents for evaluation of laceration to the right palm.  Vital signs are stable patient NAD on exam.  Differential diagnosis includes, but is not limited to, laceration, wound infection, less likely, nerve injury, vascular injury, tendon injury.  Patient's presentation is most consistent with acute, uncomplicated illness.  Advised patient update her tetanus shot today and she refused.  I explained to the patient that this wound is outside the window for when I would use sutures to close.  Will clean the wound thoroughly and use Steri-Strips.  See procedure note for further details.  Discussed signs of infection to watch for and return precautions.  Patient voiced understanding, all questions were answered and she is stable at discharge.      FINAL CLINICAL IMPRESSION(S) / ED DIAGNOSES   Final diagnoses:  Laceration of right palm, initial encounter     Rx / DC Orders   ED Discharge Orders     None        Note:  This document was prepared using Dragon voice recognition software and may include unintentional dictation errors.   Cleaster Tinnie LABOR, PA-C 06/03/24 1502    Arlander [place], MD 06/03/24 [phone]

## 2024-10-17 ENCOUNTER — Emergency Department
Admission: EM | Admit: 2024-10-17 | Discharge: 2024-10-17 | Disposition: A | Discharge status: Home / Self Care | Attending: Emergency Medicine | Admitting: Emergency Medicine

## 2024-10-17 ENCOUNTER — Encounter: Payer: Self-pay | Admitting: Emergency Medicine

## 2024-10-17 ENCOUNTER — Other Ambulatory Visit: Payer: Self-pay

## 2024-10-17 DIAGNOSIS — L739 Follicular disorder, unspecified: Secondary | ICD-10-CM | POA: Diagnosis not present

## 2024-10-17 DIAGNOSIS — H6121 Impacted cerumen, right ear: Secondary | ICD-10-CM | POA: Insufficient documentation

## 2024-10-17 MED ORDER — CARBAMIDE PEROXIDE 6.5 % OT SOLN: 5.0000 [drp] | Freq: Two times a day (BID) | OTIC | 2 refills | Status: AC

## 2024-10-17 NOTE — ED Provider Notes (Signed)
 "  [facility] Provider Note    Event Date/Time   First MD Initiated Contact with Patient 10/17/24 [phone]     (approximate)   History   Cerumen Impaction   HPI  Jamie Gentry is a 29 y.o. female with PMH of obesity presents for evaluation of her right ear being clogged for the past 2 weeks.  Patient also has a small area of swelling under the right axilla that began a week ago that she would like looked at as well.  Patient is not had any drainage from the axilla or ear.  No fevers or chills.      Physical Exam   Triage Vital Signs: ED Triage Vitals  Encounter Vitals Group     BP 10/17/24 0841 105/77     Girls Systolic BP Percentile --      Girls Diastolic BP Percentile --      Boys Systolic BP Percentile --      Boys Diastolic BP Percentile --      Pulse Rate 10/17/24 0841 86     Resp 10/17/24 0841 20     Temp 10/17/24 0841 98.3 F (36.8 C)     Temp Source 10/17/24 0841 Oral     SpO2 10/17/24 0841 98 %     Weight 10/17/24 0839 190 lb (86.2 kg)     Height 10/17/24 0839 5' 4 (1.626 m)     Head Circumference --      Peak Flow --      Pain Score 10/17/24 0839 10     Pain Loc --      Pain Education --      Exclude from Growth Chart --     Most recent vital signs: Vitals:   10/17/24 0841  BP: 105/77  Pulse: 86  Resp: 20  Temp: 98.3 F (36.8 C)  SpO2: 98%   General: Awake, no distress.  CV:  Good peripheral perfusion.  Resp:  Normal effort.  Abd:  No distention.  Other:  Cerumen buildup in the right ear, left TM is translucent, enlarged hair follicle in the right axilla but no overlying erythema, warmth or fluctuance   ED Results / Procedures / Treatments   Labs (all labs ordered are listed, but only abnormal results are displayed) Labs Reviewed - No data to display    PROCEDURES:  Critical Care performed: No  Procedures   MEDICATIONS ORDERED IN ED: Medications - No data to display   IMPRESSION / MDM / ASSESSMENT  AND PLAN / ED COURSE  I reviewed the triage vital signs and the nursing notes.                             29 year old female presents for evaluation of earwax buildup and swelling in the right axilla.  Vital signs are stable patient NAD on exam.  Differential diagnosis includes, but is not limited to, cerumen impaction, otitis media, otitis externa, folliculitis, cellulitis, abscess.  Patient's presentation is most consistent with acute, uncomplicated illness.  Patient has an enlarged hair follicle in the right axilla, likely due to shaving recently.  It is not an abscess at this point in time.  I did advise patient to use warm compresses to prevent the development of an abscess.  Discussed exfoliating and using antibacterial soap before shaving.  Patient's right ear had earwax buildup that was removed using a curette.  Once wax is removed, TM  was translucent.  Will send Debrox solution for patient to use as needed to prevent earwax buildup.  Advised against using Q-tips.  Patient was given a note for work.  She was understanding, all questions were answered and she stable at discharge.      FINAL CLINICAL IMPRESSION(S) / ED DIAGNOSES   Final diagnoses:  Impacted cerumen of right ear     Rx / DC Orders   ED Discharge Orders          Ordered    carbamide peroxide (DEBROX) 6.5 % OTIC solution  2 times daily        10/17/24 0932             Note:  This document was prepared using Dragon voice recognition software and may include unintentional dictation errors.   Cleaster Tinnie LABOR, PA-C 10/17/24 [id number]    Dorothyann Drivers, MD 10/17/24 1306  "

## 2024-10-17 NOTE — Discharge Instructions (Addendum)
 A bump in your armpit is likely an ingrown hair.  Occasionally these develop into an abscess.  Please use warm compresses over the area to keep it soft.  It may begin to drain on its own.  I recommend exfoliating and using an antibacterial soap before shaving.  I have sent some eardrops to the pharmacy for you to use to prevent buildup of earwax.  I would use this once a week before showering to help keep the wax from building up.  Place the drops in your ear and lay on your side for about 15 minutes and then you can rinse it out in the shower.

## 2024-10-17 NOTE — ED Triage Notes (Signed)
 Pt via POV from home. Pt c/o clogged R ear for the past 2 weeks, denies pain. Report some swelling under the R axilla that started 1 week ago. Denies any drainage. Pt is A&Ox4 and NAD, ambulatory to triage.
